# Patient Record
Sex: Female | Born: 1945 | Race: Black or African American | Hispanic: No | Marital: Single | Smoking: Never smoker
Health system: Southern US, Community
[De-identification: ages and names within clinical notes are randomized; demographics above are authoritative.]

## PROBLEM LIST (undated history)

## (undated) DIAGNOSIS — F259 Schizoaffective disorder, unspecified: Secondary | ICD-10-CM

## (undated) DIAGNOSIS — J449 Chronic obstructive pulmonary disease, unspecified: Secondary | ICD-10-CM

## (undated) DIAGNOSIS — R001 Bradycardia, unspecified: Secondary | ICD-10-CM

## (undated) DIAGNOSIS — F0390 Unspecified dementia without behavioral disturbance: Secondary | ICD-10-CM

## (undated) DIAGNOSIS — G20 Parkinson's disease: Secondary | ICD-10-CM

## (undated) DIAGNOSIS — F250 Schizoaffective disorder, bipolar type: Secondary | ICD-10-CM

## (undated) DIAGNOSIS — E119 Type 2 diabetes mellitus without complications: Secondary | ICD-10-CM

## (undated) DIAGNOSIS — D62 Acute posthemorrhagic anemia: Secondary | ICD-10-CM

## (undated) DIAGNOSIS — Z950 Presence of cardiac pacemaker: Secondary | ICD-10-CM

## (undated) DIAGNOSIS — D4959 Neoplasm of unspecified behavior of other genitourinary organ: Secondary | ICD-10-CM

## (undated) DIAGNOSIS — I10 Essential (primary) hypertension: Secondary | ICD-10-CM

## (undated) DIAGNOSIS — G20A1 Parkinson's disease without dyskinesia, without mention of fluctuations: Secondary | ICD-10-CM

## (undated) HISTORY — PX: HIP SURGERY: SHX245

---

## 2007-03-29 ENCOUNTER — Emergency Department: Payer: Self-pay | Admitting: Emergency Medicine

## 2007-03-29 ENCOUNTER — Other Ambulatory Visit: Payer: Self-pay

## 2011-09-16 LAB — COMPREHENSIVE METABOLIC PANEL
Alkaline Phosphatase: 133 U/L (ref 50–136)
BUN: 8 mg/dL (ref 7–18)
Bilirubin,Total: 0.1 mg/dL — ABNORMAL LOW (ref 0.2–1.0)
Chloride: 108 mmol/L — ABNORMAL HIGH (ref 98–107)
Creatinine: 0.67 mg/dL (ref 0.60–1.30)
EGFR (African American): >60
Glucose: 192 mg/dL — ABNORMAL HIGH (ref 65–99)
SGPT (ALT): 15 U/L
Total Protein: 7 g/dL (ref 6.4–8.2)

## 2011-09-16 LAB — TSH: Thyroid Stimulating Horm: 1.7 u[IU]/mL

## 2011-09-16 LAB — URINALYSIS, COMPLETE
Bilirubin,UR: NEGATIVE
Ketone: NEGATIVE
Ph: 5 (ref 4.5–8.0)
Protein: 30
Specific Gravity: 1.023 (ref 1.003–1.030)
Squamous Epithelial: 2

## 2011-09-16 LAB — ETHANOL
Ethanol %: <0.003 % (ref 0.000–0.080)
Ethanol: <3 mg/dL

## 2011-09-16 LAB — CBC
HGB: 15.3 g/dL (ref 12.0–16.0)
MCH: 31.6 pg (ref 26.0–34.0)
MCV: 90 fL (ref 80–100)
Platelet: 173 10*3/uL (ref 150–440)
RDW: 14.4 % (ref 11.5–14.5)
WBC: 3.9 10*3/uL (ref 3.6–11.0)

## 2011-09-16 LAB — DRUG SCREEN, URINE
Barbiturates, Ur Screen: NEGATIVE (ref ?–200)
Cannabinoid 50 Ng, Ur ~~LOC~~: NEGATIVE (ref ?–50)
Opiate, Ur Screen: NEGATIVE (ref ?–300)
Phencyclidine (PCP) Ur S: NEGATIVE (ref ?–25)

## 2011-09-18 ENCOUNTER — Inpatient Hospital Stay: Payer: Self-pay | Admitting: Psychiatry

## 2011-09-22 LAB — LIPID PANEL
Cholesterol: 115 mg/dL (ref 0–200)
HDL Cholesterol: 35 mg/dL — ABNORMAL LOW (ref 40–60)
Ldl Cholesterol, Calc: 66 mg/dL (ref 0–100)
Triglycerides: 69 mg/dL (ref 0–200)

## 2014-06-03 NOTE — H&P (Signed)
PATIENT NAME:  Felicia Grant, Felicia Grant MR#:  [id number] DATE OF BIRTH:  1945/02/27  DATE OF ADMISSION:  09/18/2011  Assessed on 09/19/2011.   REFERRING PHYSICIAN: Lenise Arena, M.D.   ATTENDING PHYSICIAN: Orson Slick, M.D.   IDENTIFYING DATA: Ms. Muller is a 69 year old female with history of psychosis.   CHIEF COMPLAINT: I want to go home.   HISTORY OF PRESENT ILLNESS: Ms. Raimondi is an extremely poor historian. It is unclear whether she is delusional or demented. She lives in a nursing home but tells me that she lives home alone and had some children who do not want to help her and has to resolve being a neighbor as a helper. She claims to be driving a car and able to take care of everything. She cooks. She shops. 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 According to her chart, the patient has been a resident of an assisted living facility for a long time. Reportedly, she started refusing her medications and became increasingly psychotic and disorganized. There is a story about alcohol. At times admitted to drinking alcohol, but apparently this is not so. She has a history of alcoholism, but has not been drinking alcohol lately. It is very difficult to make any sense of her story. On admission the patient did not respond to her name, but wanted to be called Kristian Covey.   PAST PSYCHIATRIC HISTORY: She reportedly has a long history of mental illness with a diagnosis of schizophrenia, had numerous inpatient hospitalization at [facility]. This is her first hospitalization in our hospital. She was here briefly in the Emergency Room in 2009 and was most likely transferred to another facility. We do not know her history of substance abuse or suicide attempts.   FAMILY PSYCHIATRIC HISTORY: The patient denies.   PAST MEDICAL HISTORY:  1. Hypertension.  2. Coronary artery disease.  3. Dyslipidemia.   4. Urinary incontinence.  5. Chronic obstructive pulmonary disease.   ALLERGIES: No known drug allergies.   MEDICATIONS ON ADMISSION:  1. Aspirin 81 mg.  2. Zyrtec 10 mg daily.  3. Aricept 10 mg at bedtime.  4. Lisinopril 20 mg daily.  5. Naproxen 250 mg twice daily. 6. Riomet 500 mg in 5 mL solution 10 mg twice daily with breakfast and supper. 7. Xopenex 45 mcg inhaler every 4 to 6 hours as needed for shortness of breath. 8. Risperdal 3 mg in the morning, 5 mg at bedtime. 9. Zocor 40 mg at bedtime. 10. Detrol LA 4 mg daily.   SOCIAL HISTORY: As above. The patient reports that she was in the [employer]. No details provided. She worked as a Quarry manager. She has been disabled for a long time. Apparently, the patient will be allowed to return to her group home. In addition, she has a guardian   REVIEW OF SYSTEMS: CONSTITUTIONAL: No fevers or chills. Positive for weakness and fatigue. The patient uses a walker and a wheelchair. EYES: No double or blurred vision. ENT: No hearing loss. RESPIRATORY: No shortness of breath or cough. CARDIOVASCULAR: No chest pain or orthopnea. GASTROINTESTINAL: No abdominal pain, nausea, vomiting, or diarrhea. GENITOURINARY: Positive for incontinence. She smells strongly of urine. ENDOCRINE: No heat or cold intolerance. LYMPHATIC: No anemia or easy bruising. INTEGUMENTARY: No acne or rash. MUSCULOSKELETAL: No muscle or joint pain. NEUROLOGIC: No tingling or weakness. PSYCHIATRIC: See history of present illness for details.   PHYSICAL EXAMINATION:  VITAL  SIGNS: Blood pressure 138/77, pulse 77, respirations 20, temperature 97.6.   GENERAL: This is an obese elderly female in no acute distress.   HEENT: The pupils are equal, round, and reactive to light. Sclerae are anicteric.   NECK: Supple. No thyromegaly.   LUNGS: Clear to auscultation. No dullness to percussion.   HEART: Regular rhythm and rate. No murmurs, rubs, or gallops.   ABDOMEN: Soft, nontender, nondistended.  Positive bowel sounds.   MUSCULOSKELETAL: Decreased muscle strength in all extremities.   SKIN: No rashes or bruises.   LYMPHATIC: No cervical adenopathy.   NEUROLOGIC: Cranial nerves II through XII are intact.   LABORATORY DATA: Chemistries are within normal limits except for blood glucose of 192. Blood alcohol level is 0. LFTs within normal limits. TSH 1.7. Urine tox screen negative for substances. CBC within normal limits. Urinalysis is suggestive of urinary tract infection with trace of leukocyte esterase and 16 white blood count per field with growing Escherichia coli.   MENTAL STATUS EXAMINATION: The patient is alert. She is oriented hardly to person since she believes that she is a doctor herself, not to place, time or the situation. She is cooperative, rather talkative, makes absolutely no sense. She is poorly groomed. There is a strong body odor. She maintains some eye contact. Her speech is soft. Mood is depressed with flat affect. Thought processing is disorganized. Thought content: She denies thoughts of hurting herself or others. She is paranoid and delusional. She seems to attend to internal stimuli. Her cognition is difficult to assess. Her insight and judgment are very poor.   SUICIDE RISK ASSESSMENT ON ADMISSION: This is a patient with history of psychosis who became increasingly disorganized in the context of medication noncompliance. She is in no position to plan or execute a suicide attempt. She needs supervision and support.   INITIAL DIAGNOSES:  AXIS I:  1. Paranoid schizophrenia per history, rule out urinary tract infection delirium.  2. 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   AXIS II: Deferred.   AXIS III:  1. Urinary tract infection.  2. Urinary incontinence. 3. Arthralgia.  4. Allergies.  5. Chronic obstructive pulmonary disease. 6. Dyslipidemia.   AXIS IV: Mental illness, treatment noncompliance, physical illness, primary support, cognitive decline,  loss of way of life, poor coping skills.   AXIS V: GAF on admission 25.   PLAN: The patient was admitted to [facility] unit for safety, stabilization, and medication management. She was initially placed on suicide precautions and was closely monitored for any unsafe behaviors. She underwent full psychiatric and risk assessment. She received pharmacotherapy, individual and group psychotherapy, substance abuse counseling, and support from therapeutic milieu.  1. Psychosis. We will restart her medication, Risperdal, at relatively high-dose and monitor her symptoms.  2. Cognitive decline. She is on Aricept. We will continue that. 3. Medical. She was started on Macrobid in the Emergency Room for urinary tract infection. We will continue treatment. Hopefully her symptoms resolved if they were brought about by urinary tract infection delirium. We will continue all other medications and monitor her blood pressure.  4. Social. The social worker already spoke with guardian group home. The patient will be allowed to return there when well.   DISPOSITION: As above.  ____________________________ Wardell Honour. Bary Leriche, MD jbp:ap D: 09/19/2011 18:34:35 ET T: 09/20/2011 07:03:07 ET JOB#: [id number]  cc: Kira Hartl B. Bary Leriche, MD, <Dictator> Clovis Fredrickson MD ELECTRONICALLY SIGNED 09/22/2011 23:09

## 2014-06-03 NOTE — H&P (Signed)
PATIENT NAME:  Felicia Grant, Felicia Grant MR#:  [id number] DATE OF BIRTH:  1945-08-27  DATE OF ADMISSION:  09/18/2011  IDENTIFYING INFORMATION: The patient is a 69 year old African American female who is retired as a Marine scientist and has been living at a group home for many years. The patient was brought for admission and evaluation to [facility] with chief complaint "they want to starve me to death". According to the information obtained from the intake nurse, the patient had been alcoholic and had been drinking alcohol and had falling spells and wants to get detox and is in a wheelchair. When the patient was asked about the same, she absolutely denied drinking alcohol and she said her last drink of alcohol was many years ago and she has no problems with drinking alcohol. When the Emergency Room chart was reviewed, it states that the patient is here for psychosis and there is no mention of alcohol in the chart.   PAST PSYCHIATRIC HISTORY: The patient reports that she was admitted to Psychiatry but she cannot remember the details. The patient is a very poor historian and not much details are being documented in the chart. No mention of alcohol was noted in the chart which was reviewed by the undersigned. 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   FAMILY HISTORY OF MENTAL ILLNESS: The patient denies any family history of mental illness and denies any suicide attempts.  FAMILY HISTORY: The patient reports that her parents did not want her because they wanted a boy. Her older sister raised her. She could not give any further details.   SOCIAL HISTORY: The patient reports that she graduated from high school and has two years of college.  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 She reports  she had been overseas but cannot give any details.   MARRIAGES: The patient reports she has been married but is separated for many years.   ALCOHOL AND DRUGS: The patient reports that she used to drink alcohol many years ago but currently no problem and has not had a drink in many years. The patient reports that the lady at the group home wanted to stir up everything and so she must have said she was drinking alcohol and it's not true. Denies any street drugs or prescription drug abuse. Denies smoking nicotine cigarettes.  MEDICAL HISTORY: The patient denies any history of high blood pressure or diabetes. Admits that when she was in the [employer] she was sick and she needed a new heart and her father gave her a new heart. When she was asked if she has a scar from the surgery, she denies the same. According to the chart, the patient has high blood pressure. Admits that she cannot take penicillin. When she was asked if she is being followed by any physician, she stated "I'm the doctor myself".   PHYSICAL EXAMINATION:  VITAL SIGNS: Temperature 97.3, pulse 74 per minute and regular, respirations 20 per minute and regular, blood pressure 182/90 mmHg.   GENERAL: The patient reports that she is in a wheelchair because of back problems and hip problems and she had a hip fracture on the right side and pointed to the right side. She is in pain and she cannot move much.  She reports she can barely get out of the wheelchair but refused to do so.  HEENT: Head is normocephalic and atraumatic. Pupils equal, round, and reactive to light and accommodation. Fundi bilaterally benign. EOMS intact. Tympanic membranes intact. No exudates. The patient has difficult examination while sitting in the wheelchair.  NECK: Supple without any organomegaly, lymphadenopathy, or thyromegaly.   CHEST: Normal expansion. Normal breath sounds heard.   HEART: Normal S1, S2 heard.   ABDOMEN: Very obese, soft. Bowel sounds heard.    RECTAL/PELVIC: Deferred.   NEUROLOGIC: Gait not tested as the patient is in a wheelchair and reports she has pelvic fracture and she cannot get out of the wheelchair. Cranial nerves II through XII grossly intact. Deep tendon reflexes 2+. Plantars are normal response.   MENTAL STATUS EXAMINATION: The patient is dressed in hospital pajamas, alert. She said this is a hospital but could not name the hospital. She said it was [place] but could not name the capital. She could not name the current Software engineer. Denies feeling depressed. Denies feeling hopeless and helpless. Denies hearing voices or seeing things. However, according to the information obtained from the Emergency Room the patient was brought for psychosis. According to the information obtained from the Emergency Room, the patient is paranoid but she denies feeling paranoid. When she was asked about calculations, she could not do any calculations. For 2 plus 2 she said 3 and further calculations were not done. Cognition is very basic and below average. She barely knew that she was in the hospital. She knew her name and age. Denies any active ideas or plans to hurt herself further. Regarding judgment for a fire, she reported "I roll myself out" and smiled. Insight and judgment guarded. Further mental status could not be done.   IMPRESSION:  AXIS I:  1. History of psychosis according to the Emergency Room, to be ruled out. 2. Alcohol dependence according to the intake Education officer, museum, which needs to be evaluated.   AXIS II: Deferred.   AXIS III: Chronic back pain status post hip fracture on the right side according to the patient and is in a wheelchair because of the same.   AXIS IV: Severe. Living in a group home for many years. History is not obtained and not reliable.  AXIS V: GAF 25.        PLAN: The patient is admitted to [facility] for close observation, evaluation, and help. She will be started on all the p.r.n.  medications and will be observed. Social Services is to contact the group home for more details about the patient so that appropriate treatment can be given and appropriate follow-up can be made.  ____________________________ Wallace Cullens. Franchot Mimes, MD skc:drc D: 09/18/2011 16:22:06 ET T: 09/19/2011 05:50:49 ET JOB#: [id number]  cc: Arlyn Leak K. Franchot Mimes, MD, <Dictator> Dewain Penning MD ELECTRONICALLY SIGNED 09/24/2011 19:36

## 2015-04-05 ENCOUNTER — Emergency Department (HOSPITAL_COMMUNITY): Payer: Medicare Other

## 2015-04-05 ENCOUNTER — Emergency Department (HOSPITAL_COMMUNITY)
Admission: EM | Admit: 2015-04-05 | Discharge: 2015-04-05 | Disposition: A | Discharge status: Home / Self Care | Payer: Medicare Other | Attending: Emergency Medicine | Admitting: Emergency Medicine

## 2015-04-05 ENCOUNTER — Encounter (HOSPITAL_COMMUNITY): Payer: Self-pay | Admitting: *Deleted

## 2015-04-05 DIAGNOSIS — M1611 Unilateral primary osteoarthritis, right hip: Secondary | ICD-10-CM | POA: Diagnosis not present

## 2015-04-05 DIAGNOSIS — R319 Hematuria, unspecified: Secondary | ICD-10-CM | POA: Diagnosis not present

## 2015-04-05 DIAGNOSIS — H6123 Impacted cerumen, bilateral: Secondary | ICD-10-CM | POA: Insufficient documentation

## 2015-04-05 DIAGNOSIS — J449 Chronic obstructive pulmonary disease, unspecified: Secondary | ICD-10-CM | POA: Insufficient documentation

## 2015-04-05 DIAGNOSIS — R42 Dizziness and giddiness: Secondary | ICD-10-CM | POA: Diagnosis not present

## 2015-04-05 DIAGNOSIS — Z7982 Long term (current) use of aspirin: Secondary | ICD-10-CM | POA: Insufficient documentation

## 2015-04-05 DIAGNOSIS — I10 Essential (primary) hypertension: Secondary | ICD-10-CM | POA: Diagnosis not present

## 2015-04-05 DIAGNOSIS — Z7984 Long term (current) use of oral hypoglycemic drugs: Secondary | ICD-10-CM | POA: Insufficient documentation

## 2015-04-05 DIAGNOSIS — G3183 Dementia with Lewy bodies: Secondary | ICD-10-CM | POA: Diagnosis not present

## 2015-04-05 DIAGNOSIS — E119 Type 2 diabetes mellitus without complications: Secondary | ICD-10-CM | POA: Insufficient documentation

## 2015-04-05 DIAGNOSIS — F250 Schizoaffective disorder, bipolar type: Secondary | ICD-10-CM | POA: Diagnosis not present

## 2015-04-05 DIAGNOSIS — Z79899 Other long term (current) drug therapy: Secondary | ICD-10-CM | POA: Diagnosis not present

## 2015-04-05 DIAGNOSIS — S79911A Unspecified injury of right hip, initial encounter: Secondary | ICD-10-CM | POA: Diagnosis present

## 2015-04-05 DIAGNOSIS — Y998 Other external cause status: Secondary | ICD-10-CM | POA: Insufficient documentation

## 2015-04-05 DIAGNOSIS — Y9301 Activity, walking, marching and hiking: Secondary | ICD-10-CM | POA: Insufficient documentation

## 2015-04-05 DIAGNOSIS — Y92128 Other place in nursing home as the place of occurrence of the external cause: Secondary | ICD-10-CM | POA: Diagnosis not present

## 2015-04-05 DIAGNOSIS — W19XXXA Unspecified fall, initial encounter: Secondary | ICD-10-CM

## 2015-04-05 DIAGNOSIS — W1839XA Other fall on same level, initial encounter: Secondary | ICD-10-CM | POA: Insufficient documentation

## 2015-04-05 HISTORY — DX: Type 2 diabetes mellitus without complications: E11.9

## 2015-04-05 HISTORY — DX: Bradycardia, unspecified: R00.1

## 2015-04-05 HISTORY — DX: Schizoaffective disorder, unspecified: F25.9

## 2015-04-05 HISTORY — DX: Essential (primary) hypertension: I10

## 2015-04-05 HISTORY — DX: Parkinson's disease without dyskinesia, without mention of fluctuations: G20.A1

## 2015-04-05 HISTORY — DX: Unspecified dementia, unspecified severity, without behavioral disturbance, psychotic disturbance, mood disturbance, and anxiety: F03.90

## 2015-04-05 HISTORY — DX: Chronic obstructive pulmonary disease, unspecified: J44.9

## 2015-04-05 HISTORY — DX: Parkinson's disease: G20

## 2015-04-05 HISTORY — DX: Schizoaffective disorder, bipolar type: F25.0

## 2015-04-05 LAB — URINALYSIS, ROUTINE W REFLEX MICROSCOPIC
BILIRUBIN URINE: NEGATIVE
Glucose, UA: NEGATIVE mg/dL
KETONES UR: NEGATIVE mg/dL
Leukocytes, UA: NEGATIVE
NITRITE: NEGATIVE
PROTEIN: NEGATIVE mg/dL
Specific Gravity, Urine: 1.025 (ref 1.005–1.030)
pH: 5 (ref 5.0–8.0)

## 2015-04-05 LAB — URINE MICROSCOPIC-ADD ON
BACTERIA UA: NONE SEEN
WBC UA: NONE SEEN WBC/hpf (ref 0–5)

## 2015-04-05 NOTE — ED Provider Notes (Signed)
CSN: [id number]     Arrival date & time 04/05/15  [id number] History   First MD Initiated Contact with Patient 04/05/15 [phone]     Chief Complaint  Patient presents with  . Fall     (Consider location/radiation/quality/duration/timing/severity/associated sxs/prior Treatment) The history is provided by the patient, the nursing home and medical records.   Felicia Grant is a 70 y.o. female from a local assisted living facility presenting with reports of falls x 2 over the past 3 days.  She walking using a walker at baseline, describes having constant pain, especially in her right hip and leg which "gave out" on her today causing her fall today.  She denies head injury, denies lightheadedness, but does endorse some dizziness and decreased hearing acuity at baseline.  She is a diabetic, has parkinsons disease and has dementia at baseline but appears able to give a fairly good history.  She denies back pain, focal weakness or numbness.      Past Medical History  Diagnosis Date  . Dementia   . Bradycardia   . Hypertension   . Diabetes mellitus without complication ([place])   . Parkinson's disease ([place])   . Schizo affective schizophrenia ([place])   . COPD (chronic obstructive pulmonary disease) [facility])    Past Surgical History  Procedure Laterality Date  . Hip surgery     No family history on file. Social History  Substance Use Topics  . Smoking status: Never Smoker   . Smokeless tobacco: None  . Alcohol Use: No   OB History    No data available     Review of Systems  Constitutional: Negative for fever.  HENT: Positive for hearing loss. Negative for congestion, ear discharge, ear pain and sore throat.   Eyes: Negative.   Respiratory: Negative for chest tightness and shortness of breath.   Cardiovascular: Negative for chest pain.  Gastrointestinal: Negative for nausea and abdominal pain.  Genitourinary: Negative.   Musculoskeletal: Positive for arthralgias. Negative for back pain, joint  swelling and neck pain.  Skin: Negative.  Negative for rash and wound.  Neurological: Positive for dizziness. Negative for weakness, light-headedness, numbness and headaches.  Psychiatric/Behavioral: Negative.       Allergies  Review of patient's allergies indicates no known allergies.  Home Medications   Prior to Admission medications   Medication Sig Start Date End Date Taking? Authorizing Provider  amLODipine (NORVASC) 5 MG tablet Take 5 mg by mouth daily.   Yes Historical Provider, MD  aspirin EC 81 MG tablet Take 81 mg by mouth daily.   Yes Historical Provider, MD  cetirizine (ZYRTEC) 10 MG tablet Take 10 mg by mouth daily.   Yes Historical Provider, MD  cholecalciferol (VITAMIN D) 400 units TABS tablet Take 400 Units by mouth daily.   Yes Historical Provider, MD  ferrous sulfate 325 (65 FE) MG tablet Take 325 mg by mouth at bedtime.   Yes Historical Provider, MD  lisinopril (PRINIVIL,ZESTRIL) 40 MG tablet Take 40 mg by mouth daily.   Yes Historical Provider, MD  metFORMIN (GLUCOPHAGE) 500 MG tablet Take 500-1,000 mg by mouth 2 (two) times daily with a meal. 2 tablets every morning and 1 tablet every evening   Yes Historical Provider, MD  nystatin cream (MYCOSTATIN) Apply 1 application topically 2 (two) times daily.   Yes Historical Provider, MD  risperiDONE (RISPERDAL) 1 MG tablet Take 1 mg by mouth at bedtime.   Yes Historical Provider, MD  senna (SENOKOT) 8.6 MG TABS tablet Take 1  tablet by mouth daily.   Yes Historical Provider, MD  simvastatin (ZOCOR) 40 MG tablet Take 40 mg by mouth at bedtime.   Yes Historical Provider, MD  tolterodine (DETROL LA) 4 MG 24 hr capsule Take 4 mg by mouth daily.   Yes Historical Provider, MD   BP 124/80 mmHg  Pulse 73  Temp(Src) 98.4 F (36.9 C) (Oral)  Resp 14  SpO2 98% Physical Exam  Constitutional: She appears well-nourished.  HENT:  Head: Normocephalic and atraumatic.  Right Ear: No mastoid tenderness. Decreased hearing is noted.   Left Ear: No mastoid tenderness. Decreased hearing is noted.  Mouth/Throat: Uvula is midline and oropharynx is clear and moist. Mucous membranes are not pale and not dry.  Bilateral cerumen impaction.  Unable to visualize TM's.  Eyes: Pupils are equal, round, and reactive to light. Right eye exhibits no nystagmus. Left eye exhibits no nystagmus.  Neck: Normal range of motion. No spinous process tenderness and no muscular tenderness present. Normal range of motion present.  Cardiovascular: Normal rate, regular rhythm, normal heart sounds and intact distal pulses.   Pulses:      Dorsalis pedis pulses are 2+ on the right side, and 2+ on the left side.  Pulmonary/Chest: Effort normal and breath sounds normal. She has no wheezes.  Abdominal: Soft. Bowel sounds are normal. There is no tenderness.  Musculoskeletal: Normal range of motion.       Right hip: She exhibits bony tenderness. She exhibits no deformity.       Lumbar back: She exhibits no bony tenderness.  ttp right anterior groin radiating to right greater trochanter. No leg shortening or rotation.  Neurological: She is alert. She has normal strength. No cranial nerve deficit or sensory deficit.  Equal grip strength.  Skin: Skin is warm and dry.  Psychiatric: She has a normal mood and affect.  Nursing note and vitals reviewed.   ED Course  Procedures (including critical care time) Labs Review Labs Reviewed  URINALYSIS, [place] (NOT AT [facility]) - Abnormal; Notable for the following:    Hgb urine dipstick LARGE (*)    All other components within normal limits  URINE MICROSCOPIC-ADD ON - Abnormal; Notable for the following:    Squamous Epithelial / LPF 0-5 (*)    All other components within normal limits    Imaging Review Dg Lumbar Spine Complete  04/05/2015  CLINICAL DATA:  Fall.  Back pain. EXAM: LUMBAR SPINE - COMPLETE 4+ VIEW COMPARISON:  None. FINDINGS: No fracture. Minimal anterolisthesis of L4 on L5 with a  mild retrolisthesis of L5 on S1. Mild loss disc height at L4-L5 with moderate loss of disc height at L5-S1. There are minor endplate osteophytes along the mid to lower lumbar spine. Bones are diffusely demineralized. Calcifications seen along a normal caliber abdominal aorta. There is significant increased stool noted throughout the visualized colon. IMPRESSION: 1. No fracture or acute finding. 2. Degenerative changes as described with diffuse bone demineralization. 3. Significant increased stool noted throughout the visualized colon. Electronically Signed   By: Lajean Manes M.D.   On: 04/05/2015 11:26   Ct Head Wo Contrast  04/05/2015  CLINICAL DATA:  Pt comes in from [facility]. Pt states she was walking down the hall and her "legs gave out." Pt denies hitting her head or any dizziness when she fell. Hx of diabetes,HTN,dementia, Parkinson".[place] EXAM: CT HEAD WITHOUT CONTRAST TECHNIQUE: Contiguous axial images were obtained from the base of the skull through the  vertex without intravenous contrast. COMPARISON:  None. FINDINGS: There is mild periventricular white matter change consistent with small vessel disease. Small lacunar infarct identified within the left globus pallidus is remote. There is no intra or extra-axial fluid collection or mass lesion. The basilar cisterns and ventricles have a normal appearance. There is no CT evidence for acute infarction or hemorrhage. There is mild mucoperiosteal thickening within the paranasal sinuses. No air-fluid levels are identified to indicate acute sinusitis. There is atherosclerotic calcification of the internal carotid arteries. No acute calvarial injury. IMPRESSION: 1. Small vessel disease. 2. 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 3. Mild, chronic sinusitis. 4.  No evidence for acute intracranial abnormality. Electronically Signed   By: Nolon Nations M.D.   On: 04/05/2015 11:17   Ct Hip Right Wo Contrast  04/05/2015   CLINICAL DATA:  Status post fall today with right hip injury and pain. Initial encounter. EXAM: CT OF THE RIGHT HIP WITHOUT CONTRAST TECHNIQUE: Multidetector CT imaging of the right hip was performed according to the standard protocol. Multiplanar CT image reconstructions were also generated. COMPARISON:  Plain films of the right hip earlier today. FINDINGS: There is no fracture or dislocation. The patient has severe right hip osteoarthritis with bone-on-bone joint space narrowing, subchondral sclerosis and very bulky osteophytosis. Musculature about the right hip is intact and normal in appearance. Imaged intrapelvic contents demonstrate some atherosclerosis but are otherwise unremarkable. IMPRESSION: No acute abnormality. Severe right hip osteoarthritis. Electronically Signed   By: Inge Rise M.D.   On: 04/05/2015 13:15   Dg Hip Unilat W Or W/o Pelvis 2-3 Views Right  04/05/2015  CLINICAL DATA:  Fall today, pain lower back and right hip EXAM: DG HIP (WITH OR WITHOUT PELVIS) 2-3V RIGHT COMPARISON:  None. FINDINGS: There is significant osteoarthritis of the right hip associated with moderate to severe deformity. There is loss of joint space. Fragmentation around the joint is likely related to large osteophytes at the acetabulum at as well as the femoral head. However it is difficult to exclude a minimally displaced fracture. There is no evidence for dislocation. Degenerative changes are seen in the left hip to lesser degree. IMPRESSION: 1. Significant deformity of the right hip, likely related entirely to degenerative changes. 2. However, because a minimally displaced fracture is not excluded, consider CT of the hip for further to exclude fracture in the setting of trauma. Electronically Signed   By: Nolon Nations M.D.   On: 04/05/2015 11:29   I have personally reviewed and evaluated these images and lab results as part of my medical decision-making.   EKG Interpretation None      MDM   Final  diagnoses:  Primary osteoarthritis of right hip  Fall, initial encounter  Cerumen impaction, bilateral  Hematuria     Radiological studies were viewed, interpreted and considered during the medical decision making and disposition process. I agree with radiologists reading.  Results were also discussed with patient.   Recommended f/u with pcp prn, pt may benefit from ortho referral re right hip advanced degenerative changes.  Hearing acuity issue, dizziness also possibly at last somewhat due to cerumen impactions.  Advised flushing by nursing home staff.  Advised f/u with pcp for further tx/eval if sx persist.     Evalee Jefferson, PA-C 04/05/15 2141  Noemi Chapel, MD 04/08/15 1201

## 2015-04-05 NOTE — ED Notes (Signed)
Report given to Maudie Mercury at [facility].

## 2015-04-05 NOTE — ED Notes (Signed)
Pt comes in from [facility]. Pt states she was walking down the hall and her "legs gave out." Pt denies hitting her head or any dizziness when she fell. Caregiver at bedside states that she frequently talks about her legs hurting. Pt is alert and oriented at this time

## 2015-04-05 NOTE — ED Notes (Signed)
Pt was ambualted in [place] a little trouble moving right leg.

## 2015-04-05 NOTE — Discharge Instructions (Signed)
Osteoarthritis Osteoarthritis is a disease that causes soreness and inflammation of a joint. It occurs when the cartilage at the affected joint wears down. Cartilage acts as a cushion, covering the ends of bones where they meet to form a joint. Osteoarthritis is the most common form of arthritis. It often occurs in older people. The joints affected most often by this condition include those in the:  Ends of the fingers.  Thumbs.  Neck.  Lower back.  Knees.  Hips. CAUSES  Over time, the cartilage that covers the ends of bones begins to wear away. This causes bone to rub on bone, producing pain and stiffness in the affected joints.  RISK FACTORS Certain factors can increase your chances of having osteoarthritis, including:  Older age.  Excessive body weight.  Overuse of joints.  Previous joint injury. SIGNS AND SYMPTOMS   Pain, swelling, and stiffness in the joint.  Over time, the joint may lose its normal shape.  Small deposits of bone (osteophytes) may grow on the edges of the joint.  Bits of bone or cartilage can break off and float inside the joint space. This may cause more pain and damage. DIAGNOSIS  Your health care provider will do a physical exam and ask about your symptoms. Various tests may be ordered, such as:  X-rays of the affected joint.  Blood tests to rule out other types of arthritis. Additional tests may be used to diagnose your condition. TREATMENT  Goals of treatment are to control pain and improve joint function. Treatment plans may include:  A prescribed exercise program that allows for rest and joint relief.  A weight control plan.  Pain relief techniques, such as:  Properly applied heat and cold.  Electric pulses delivered to nerve endings under the skin (transcutaneous electrical nerve stimulation [TENS]).  Massage.  Certain nutritional supplements.  Medicines to control pain, such as:  Acetaminophen.  Nonsteroidal  anti-inflammatory drugs (NSAIDs), such as naproxen.  Narcotic or central-acting agents, such as tramadol.  Corticosteroids. These can be given orally or as an injection.  Surgery to reposition the bones and relieve pain (osteotomy) or to remove loose pieces of bone and cartilage. Joint replacement may be needed in advanced states of osteoarthritis. HOME CARE INSTRUCTIONS   Take medicines only as directed by your health care provider.  Maintain a healthy weight. Follow your health care provider's instructions for weight control. This may include dietary instructions.  Exercise as directed. Your health care provider can recommend specific types of exercise. These may include:  Strengthening exercises. These are done to strengthen the muscles that support joints affected by arthritis. They can be performed with weights or with exercise bands to add resistance.  Aerobic activities. These are exercises, such as brisk walking or low-impact aerobics, that get your heart pumping.  Range-of-motion activities. These keep your joints limber.  Balance and agility exercises. These help you maintain daily living skills.  Rest your affected joints as directed by your health care provider.  Keep all follow-up visits as directed by your health care provider. SEEK MEDICAL CARE IF:   Your skin turns red.  You develop a rash in addition to your joint pain.  You have worsening joint pain.  You have a fever along with joint or muscle aches. SEEK IMMEDIATE MEDICAL CARE IF:  You have a significant loss of weight or appetite.  You have night sweats. [place] of Arthritis and Musculoskeletal and Skin Diseases: www.niams.SouthExposed.es  National Institute on  Aging: http://kim-miller.com/  American College of Rheumatology: www.rheumatology.org   This information is not intended to replace advice given to you by your health care provider. Make sure you discuss any questions you  have with your health care provider.   Document Released: 01/31/2005 Document Revised: 02/21/2014 Document Reviewed: 10/08/2012 Elsevier Interactive Patient Education 2016 [place] Impaction The structures of the external ear canal secrete a waxy substance known as cerumen. Excess cerumen can build up in the ear canal, causing a condition known as cerumen impaction. Cerumen impaction can cause ear pain and disrupt the function of the ear. The rate of cerumen production differs for each individual. In certain individuals, the configuration of the ear canal may decrease his or her ability to naturally remove cerumen. CAUSES Cerumen impaction is caused by excessive cerumen production or buildup. RISK FACTORS  Frequent use of swabs to clean ears.  Having narrow ear canals.  Having eczema.  Being dehydrated. SIGNS AND SYMPTOMS  Diminished hearing.  Ear drainage.  Ear pain.  Ear itch. TREATMENT Treatment may involve:  Over-the-counter or prescription ear drops to soften the cerumen.  Removal of cerumen by a health care provider. This may be done with:  Irrigation with warm water. This is the most common method of removal.  Ear curettes and other instruments.  Surgery. This may be done in severe cases. HOME CARE INSTRUCTIONS  Take medicines only as directed by your health care provider.  Do not insert objects into the ear with the intent of cleaning the ear. PREVENTION  Do not insert objects into the ear, even with the intent of cleaning the ear. Removing cerumen as a part of normal hygiene is not necessary, and the use of swabs in the ear canal is not recommended.  Drink enough water to keep your urine clear or pale yellow.  Control your eczema if you have it. SEEK MEDICAL CARE IF:  You develop ear pain.  You develop bleeding from the ear.  The cerumen does not clear after you use ear drops as directed.   This information is not intended to replace  advice given to you by your health care provider. Make sure you discuss any questions you have with your health care provider.   Document Released: 03/10/2004 Document Revised: 02/21/2014 Document Reviewed: 09/17/2014 Elsevier Interactive Patient Education 2016 [place] and CT scan today are negative for bony fractures.  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  Additionally, your exam findings indicate you have a bilateral cerumen impaction (ear wax buildup in your ears) which could potentially at least partially be the source of your hearing loss and dizziness.  I suggest the nursing staff at your facility flush your ears to remove this ear wax.  Also, you have microscopic evidence of blood in your urine, but no sign of infection.  You need to have a repeat urinalysis in a week to see if this blood in your urine persists.

## 2015-04-15 ENCOUNTER — Inpatient Hospital Stay (HOSPITAL_COMMUNITY): Payer: Medicare Other

## 2015-04-15 ENCOUNTER — Emergency Department (HOSPITAL_COMMUNITY): Payer: Medicare Other

## 2015-04-15 ENCOUNTER — Encounter (HOSPITAL_COMMUNITY): Payer: Self-pay | Admitting: Emergency Medicine

## 2015-04-15 ENCOUNTER — Inpatient Hospital Stay (HOSPITAL_COMMUNITY)
Admission: EM | Admit: 2015-04-15 | Discharge: 2015-04-21 | DRG: 853 | Disposition: A | Discharge status: Nursing Home (Includes ICF) | Payer: Medicare Other | Attending: Internal Medicine | Admitting: Internal Medicine

## 2015-04-15 DIAGNOSIS — J449 Chronic obstructive pulmonary disease, unspecified: Secondary | ICD-10-CM | POA: Diagnosis present

## 2015-04-15 DIAGNOSIS — F259 Schizoaffective disorder, unspecified: Secondary | ICD-10-CM | POA: Diagnosis present

## 2015-04-15 DIAGNOSIS — E118 Type 2 diabetes mellitus with unspecified complications: Secondary | ICD-10-CM | POA: Diagnosis not present

## 2015-04-15 DIAGNOSIS — E669 Obesity, unspecified: Secondary | ICD-10-CM | POA: Diagnosis present

## 2015-04-15 DIAGNOSIS — A419 Sepsis, unspecified organism: Principal | ICD-10-CM

## 2015-04-15 DIAGNOSIS — N179 Acute kidney failure, unspecified: Secondary | ICD-10-CM | POA: Diagnosis not present

## 2015-04-15 DIAGNOSIS — N320 Bladder-neck obstruction: Secondary | ICD-10-CM | POA: Diagnosis present

## 2015-04-15 DIAGNOSIS — E119 Type 2 diabetes mellitus without complications: Secondary | ICD-10-CM | POA: Diagnosis present

## 2015-04-15 DIAGNOSIS — R1030 Lower abdominal pain, unspecified: Secondary | ICD-10-CM | POA: Diagnosis present

## 2015-04-15 DIAGNOSIS — Z7982 Long term (current) use of aspirin: Secondary | ICD-10-CM

## 2015-04-15 DIAGNOSIS — N950 Postmenopausal bleeding: Secondary | ICD-10-CM | POA: Diagnosis present

## 2015-04-15 DIAGNOSIS — Z6834 Body mass index (BMI) 34.0-34.9, adult: Secondary | ICD-10-CM

## 2015-04-15 DIAGNOSIS — B964 Proteus (mirabilis) (morganii) as the cause of diseases classified elsewhere: Secondary | ICD-10-CM | POA: Diagnosis present

## 2015-04-15 DIAGNOSIS — N368 Other specified disorders of urethra: Secondary | ICD-10-CM | POA: Diagnosis present

## 2015-04-15 DIAGNOSIS — G20A1 Parkinson's disease without dyskinesia, without mention of fluctuations: Secondary | ICD-10-CM

## 2015-04-15 DIAGNOSIS — F0390 Unspecified dementia without behavioral disturbance: Secondary | ICD-10-CM | POA: Diagnosis present

## 2015-04-15 DIAGNOSIS — I10 Essential (primary) hypertension: Secondary | ICD-10-CM | POA: Diagnosis present

## 2015-04-15 DIAGNOSIS — I959 Hypotension, unspecified: Secondary | ICD-10-CM

## 2015-04-15 DIAGNOSIS — N170 Acute kidney failure with tubular necrosis: Secondary | ICD-10-CM | POA: Diagnosis present

## 2015-04-15 DIAGNOSIS — M545 Low back pain: Secondary | ICD-10-CM | POA: Diagnosis present

## 2015-04-15 DIAGNOSIS — R338 Other retention of urine: Secondary | ICD-10-CM | POA: Diagnosis not present

## 2015-04-15 DIAGNOSIS — D62 Acute posthemorrhagic anemia: Secondary | ICD-10-CM | POA: Diagnosis present

## 2015-04-15 DIAGNOSIS — D509 Iron deficiency anemia, unspecified: Secondary | ICD-10-CM | POA: Diagnosis not present

## 2015-04-15 DIAGNOSIS — G8929 Other chronic pain: Secondary | ICD-10-CM | POA: Diagnosis present

## 2015-04-15 DIAGNOSIS — C52 Malignant neoplasm of vagina: Secondary | ICD-10-CM | POA: Diagnosis present

## 2015-04-15 DIAGNOSIS — N136 Pyonephrosis: Secondary | ICD-10-CM | POA: Diagnosis present

## 2015-04-15 DIAGNOSIS — N3289 Other specified disorders of bladder: Secondary | ICD-10-CM | POA: Diagnosis present

## 2015-04-15 DIAGNOSIS — E872 Acidosis, unspecified: Secondary | ICD-10-CM

## 2015-04-15 DIAGNOSIS — C579 Malignant neoplasm of female genital organ, unspecified: Secondary | ICD-10-CM | POA: Diagnosis not present

## 2015-04-15 DIAGNOSIS — Z7984 Long term (current) use of oral hypoglycemic drugs: Secondary | ICD-10-CM | POA: Diagnosis not present

## 2015-04-15 DIAGNOSIS — R112 Nausea with vomiting, unspecified: Secondary | ICD-10-CM | POA: Diagnosis present

## 2015-04-15 DIAGNOSIS — D398 Neoplasm of uncertain behavior of other specified female genital organs: Secondary | ICD-10-CM | POA: Diagnosis not present

## 2015-04-15 DIAGNOSIS — Z950 Presence of cardiac pacemaker: Secondary | ICD-10-CM | POA: Diagnosis not present

## 2015-04-15 DIAGNOSIS — E538 Deficiency of other specified B group vitamins: Secondary | ICD-10-CM | POA: Diagnosis present

## 2015-04-15 DIAGNOSIS — G20 Parkinson's disease: Secondary | ICD-10-CM | POA: Diagnosis present

## 2015-04-15 DIAGNOSIS — F250 Schizoaffective disorder, bipolar type: Secondary | ICD-10-CM

## 2015-04-15 DIAGNOSIS — R509 Fever, unspecified: Secondary | ICD-10-CM

## 2015-04-15 DIAGNOSIS — E875 Hyperkalemia: Secondary | ICD-10-CM | POA: Diagnosis present

## 2015-04-15 DIAGNOSIS — N131 Hydronephrosis with ureteral stricture, not elsewhere classified: Secondary | ICD-10-CM | POA: Diagnosis not present

## 2015-04-15 DIAGNOSIS — I9589 Other hypotension: Secondary | ICD-10-CM

## 2015-04-15 DIAGNOSIS — D4959 Neoplasm of unspecified behavior of other genitourinary organ: Secondary | ICD-10-CM

## 2015-04-15 DIAGNOSIS — N10 Acute tubulo-interstitial nephritis: Secondary | ICD-10-CM

## 2015-04-15 DIAGNOSIS — N1330 Unspecified hydronephrosis: Secondary | ICD-10-CM | POA: Diagnosis not present

## 2015-04-15 HISTORY — DX: Presence of cardiac pacemaker: Z95.0

## 2015-04-15 HISTORY — DX: Acute posthemorrhagic anemia: D62

## 2015-04-15 HISTORY — DX: Neoplasm of unspecified behavior of other genitourinary organ: D49.59

## 2015-04-15 LAB — CBC WITH DIFFERENTIAL/PLATELET
Basophils Absolute: 0 10*3/uL (ref 0.0–0.1)
Basophils Relative: 0 %
EOS ABS: 0 10*3/uL (ref 0.0–0.7)
EOS PCT: 0 %
HCT: 27.3 % — ABNORMAL LOW (ref 36.0–46.0)
Hemoglobin: 8.3 g/dL — ABNORMAL LOW (ref 12.0–15.0)
LYMPHS ABS: 0.9 10*3/uL (ref 0.7–4.0)
LYMPHS PCT: 19 %
MCH: 21.8 pg — AB (ref 26.0–34.0)
MCHC: 30.4 g/dL (ref 30.0–36.0)
MCV: 71.8 fL — AB (ref 78.0–100.0)
MONO ABS: 1.6 10*3/uL — AB (ref 0.1–1.0)
MONOS PCT: 34 %
Neutro Abs: 2.2 10*3/uL (ref 1.7–7.7)
Neutrophils Relative %: 47 %
PLATELETS: 198 10*3/uL (ref 150–400)
RBC: 3.8 MIL/uL — AB (ref 3.87–5.11)
RDW: 19.3 % — ABNORMAL HIGH (ref 11.5–15.5)
WBC: 4.6 10*3/uL (ref 4.0–10.5)

## 2015-04-15 LAB — COMPREHENSIVE METABOLIC PANEL WITH GFR
ALT: 10 U/L — ABNORMAL LOW (ref 14–54)
AST: 22 U/L (ref 15–41)
Albumin: 3.1 g/dL — ABNORMAL LOW (ref 3.5–5.0)
Alkaline Phosphatase: 66 U/L (ref 38–126)
Anion gap: 12 (ref 5–15)
BUN: 93 mg/dL — ABNORMAL HIGH (ref 6–20)
CO2: 19 mmol/L — ABNORMAL LOW (ref 22–32)
Calcium: 8.5 mg/dL — ABNORMAL LOW (ref 8.9–10.3)
Chloride: 103 mmol/L (ref 101–111)
Creatinine, Ser: 4.95 mg/dL — ABNORMAL HIGH (ref 0.44–1.00)
GFR calc Af Amer: 9 mL/min — ABNORMAL LOW
GFR calc non Af Amer: 8 mL/min — ABNORMAL LOW
Glucose, Bld: 208 mg/dL — ABNORMAL HIGH (ref 65–99)
Potassium: 5.1 mmol/L (ref 3.5–5.1)
Sodium: 134 mmol/L — ABNORMAL LOW (ref 135–145)
Total Bilirubin: 0.5 mg/dL (ref 0.3–1.2)
Total Protein: 6.2 g/dL — ABNORMAL LOW (ref 6.5–8.1)

## 2015-04-15 LAB — URINE MICROSCOPIC-ADD ON

## 2015-04-15 LAB — URINALYSIS, ROUTINE W REFLEX MICROSCOPIC
Glucose, UA: 100 mg/dL — AB
Ketones, ur: NEGATIVE mg/dL
Nitrite: POSITIVE — AB
Protein, ur: >300 mg/dL — AB
Specific Gravity, Urine: 1.015 (ref 1.005–1.030)
pH: 8.5 — ABNORMAL HIGH (ref 5.0–8.0)

## 2015-04-15 LAB — TSH: TSH: 1.131 u[IU]/mL (ref 0.350–4.500)

## 2015-04-15 LAB — RETICULOCYTES
RBC.: 3.61 MIL/uL — ABNORMAL LOW (ref 3.87–5.11)
RETIC CT PCT: 0.5 % (ref 0.4–3.1)
Retic Count, Absolute: 18.1 10*3/uL — ABNORMAL LOW (ref 19.0–186.0)

## 2015-04-15 LAB — I-STAT CG4 LACTIC ACID, ED
Lactic Acid, Venous: 2.41 mmol/L (ref 0.5–2.0)
Lactic Acid, Venous: 1.7 mmol/L (ref 0.5–2.0)

## 2015-04-15 MED ORDER — PANTOPRAZOLE SODIUM 40 MG IV SOLR
40.0000 mg | Freq: Every day | INTRAVENOUS | Status: DC
  Administered 2015-04-15 – 2015-04-19 (×5): 40 mg via INTRAVENOUS
  Filled 2015-04-15 (×5): qty 40

## 2015-04-15 MED ORDER — SODIUM CHLORIDE 0.9 % IV BOLUS (SEPSIS): 1000.0000 mL | INTRAVENOUS | Status: DC

## 2015-04-15 MED ORDER — INSULIN ASPART 100 UNIT/ML ~~LOC~~ SOLN
0.0000 [IU] | Freq: Three times a day (TID) | SUBCUTANEOUS | Status: DC
  Administered 2015-04-16 (×3): 2 [IU] via SUBCUTANEOUS
  Administered 2015-04-17: 3 [IU] via SUBCUTANEOUS

## 2015-04-15 MED ORDER — ALBUTEROL SULFATE (2.5 MG/3ML) 0.083% IN NEBU
2.5000 mg | INHALATION_SOLUTION | RESPIRATORY_TRACT | Status: DC | PRN
Start: 2015-04-15 — End: 2015-04-21

## 2015-04-15 MED ORDER — SODIUM CHLORIDE 0.9 % IV SOLN
INTRAVENOUS | Status: DC
  Administered 2015-04-15: 20:00:00 via INTRAVENOUS

## 2015-04-15 MED ORDER — MORPHINE SULFATE (PF) 4 MG/ML IV SOLN
3.0000 mg | INTRAVENOUS | Status: DC | PRN
  Administered 2015-04-16: 3 mg via INTRAVENOUS
  Filled 2015-04-15: qty 1

## 2015-04-15 MED ORDER — ACETAMINOPHEN 650 MG RE SUPP: 650.0000 mg | Freq: Four times a day (QID) | RECTAL | Status: DC | PRN

## 2015-04-15 MED ORDER — ALUM & MAG HYDROXIDE-SIMETH 200-200-20 MG/5ML PO SUSP: 30.0000 mL | Freq: Four times a day (QID) | ORAL | Status: DC | PRN

## 2015-04-15 MED ORDER — VANCOMYCIN HCL 10 G IV SOLR
1500.0000 mg | Freq: Once | INTRAVENOUS | Status: AC
  Administered 2015-04-15: 1500 mg via INTRAVENOUS
  Filled 2015-04-15: qty 1500

## 2015-04-15 MED ORDER — PIPERACILLIN-TAZOBACTAM 3.375 G IVPB 30 MIN
3.3750 g | Freq: Once | INTRAVENOUS | Status: AC
  Administered 2015-04-15: 3.375 g via INTRAVENOUS
  Filled 2015-04-15: qty 50

## 2015-04-15 MED ORDER — VANCOMYCIN HCL IN DEXTROSE 1-5 GM/200ML-% IV SOLN: 1000.0000 mg | Freq: Once | INTRAVENOUS | Status: DC

## 2015-04-15 MED ORDER — ACETAMINOPHEN 325 MG PO TABS
650.0000 mg | ORAL_TABLET | Freq: Four times a day (QID) | ORAL | Status: DC | PRN
  Administered 2015-04-17 – 2015-04-20 (×2): 650 mg via ORAL
  Filled 2015-04-15 (×2): qty 2

## 2015-04-15 MED ORDER — ONDANSETRON HCL 4 MG/2ML IJ SOLN
4.0000 mg | Freq: Four times a day (QID) | INTRAMUSCULAR | Status: DC
  Administered 2015-04-15 – 2015-04-21 (×21): 4 mg via INTRAVENOUS
  Filled 2015-04-15 (×21): qty 2

## 2015-04-15 MED ORDER — SODIUM CHLORIDE 0.9 % IV BOLUS (SEPSIS)
1000.0000 mL | INTRAVENOUS | Status: AC
  Administered 2015-04-15 (×3): 1000 mL via INTRAVENOUS

## 2015-04-15 MED ORDER — RISPERIDONE 1 MG PO TABS
1.0000 mg | ORAL_TABLET | Freq: Every day | ORAL | Status: DC
  Administered 2015-04-16 – 2015-04-20 (×6): 1 mg via ORAL
  Filled 2015-04-15 (×8): qty 1

## 2015-04-15 NOTE — ED Notes (Signed)
Several attempts have been made at catheter placement by different staff members unsuccessfully.

## 2015-04-15 NOTE — Progress Notes (Signed)
Pharmacy Antibiotic Note  Felicia Grant is a 70 y.o. female admitted on 04/15/2015 with sepsis.  Pharmacy has been consulted for [place] dosing.  Estimated Creatinine Clearance: 11.4 mL/min (by C-G formula based on Cr of 4.95).  Plan: Zosyn 2.25gm IV q8h (renally adjusted) Vancomycin 1500mg  IV today x 1 (no additional Vanc today) F/U SCr tomorrow, evaluate Vanc maintenance dose  Height: 5\' 4"  (162.6 cm) Weight: 200 lb (90.719 kg) IBW/kg (Calculated) : 54.7  Temp (24hrs), Avg:99.1 F (37.3 C), Min:98 F (36.7 C), Max:100.6 F (38.1 C)   Recent Labs Lab 04/15/15 1106 04/15/15 1125 04/15/15 1359  WBC 4.6  --   --   CREATININE 4.95*  --   --   LATICACIDVEN  --  2.41* 1.70    No Known Allergies  Anti-infectives    Start     Dose/Rate Route Frequency Ordered Stop   04/15/15 1145  vancomycin (VANCOCIN) 1,500 mg in sodium chloride 0.9 % 500 mL IVPB     1,500 mg 250 mL/hr over 120 Minutes Intravenous  Once 04/15/15 1111 04/15/15 1343   04/15/15 1100  piperacillin-tazobactam (ZOSYN) IVPB 3.375 g     3.375 g 100 mL/hr over 30 Minutes Intravenous  Once 04/15/15 1051 04/15/15 1231   04/15/15 1100  vancomycin (VANCOCIN) IVPB 1000 mg/200 mL premix  Status:  Discontinued     1,000 mg 200 mL/hr over 60 Minutes Intravenous  Once 04/15/15 1051 04/15/15 1111     Results for orders placed or performed in visit on 09/18/11  Urine culture     Status: None   Collection Time: 09/16/11  6:11 PM  Result Value Ref Range Status   Micro Text Report   Final       SOURCE: CLEAN CATCH    ORGANISM 1                -   COMMENT                   MIXED BACTERIAL ORGANISMS   COMMENT                   RESULTS SUGGESTIVE OF CONTAMINATION   COMMENT                   SUGGEST RECOLLECTION   ANTIBIOTIC                                                       Thank you for allowing pharmacy to be a part of this patient's care.  Hart Robinsons A 04/15/2015 4:06 PM

## 2015-04-15 NOTE — ED Notes (Signed)
Attempted to insert foley x 2 with no success.

## 2015-04-15 NOTE — ED Notes (Signed)
Pt sent from [facility] assisted living for evaluation of vomiting.  Pt noted to be hypotensive and is currently being tx for UTI.

## 2015-04-15 NOTE — H&P (Signed)
[facility] History and Physical  Kylla Lewkowicz [id number] DOB: January 14, 1946 DOA: 04/15/2015  Referring physician: ED physician, Dr. Jeanell Sparrow PCP: Jani Gravel, MD   Chief Complaint: Nausea and vomiting.  HPI: Felicia Grant is a 70 y.o. female with a history of dementia, schizoaffective schizophrenia, COPD, and diabetes mellitus, who presents to the emergency department from [facility] assisted living facility with a complaint of nausea and vomiting. The patient does not know why she is here, but she does acknowledge vomiting earlier today. She also acknowledges pain when she urinates, lower abdominal pain, low back pain, and chronic pain in her legs. She is unable to give specifics, i.e., she does not recall the time of her last bowel movement, she does not recall if she had blood in her emesis. She denies chest pain and shortness of breath. She denies subjective fever and chills.  In the ED, on arrival, her blood pressure was 76/51. With a bolus of IV fluids, it improved to 126/55. She is oxygenating 93-100% on room air. She is febrile with a temperature 100.6. Her urinalysis is wholly infected appearing. Her lab data are significant for hemoglobin of 8.3, normal WBC, sodium of 134, CO2 of 19, BUN of 93, creatinine of 4.95, and glucose of 208. Her lactic acid was 2.41 on admission, but improved to 1.70. Her chest x-ray revealed low lung volumes and central venous congestion, but no acute findings. She is being admitted for further evaluation and management.   Review of Systems:  As above in history present illness. Patient also complains of chronic low back pain, chronic neck pain, chronic pain in her legs. She also has difficulty walking due to pain in her legs. Otherwise review of systems is negative.  Past Medical History  Diagnosis Date  . Dementia   . Bradycardia   . Hypertension   . Diabetes mellitus without complication ([place])   . Parkinson's disease ([place])   . Schizo  affective schizophrenia ([place])   . COPD (chronic obstructive pulmonary disease) ([place])   . Cardiac pacemaker in situ    Past Surgical History  Procedure Laterality Date  . Hip surgery     Social History: She says that she was married. She has "many children". She is a resident of [facility] assisted living facility. She reports that she has never smoked. She does not have any smokeless tobacco history on file. She reports that she does not drink alcohol. Her drug history is not on file.  No Known Allergies  Family history: She does not recall what her parents died of. She cannot recall family history of any chronic diseases.  Prior to Admission medications   Medication Sig Start Date End Date Taking? Authorizing Provider  acetaminophen (TYLENOL) 325 MG tablet Take 650 mg by mouth every 6 (six) hours as needed for mild pain or moderate pain.   Yes Historical Provider, MD  amLODipine (NORVASC) 5 MG tablet Take 5 mg by mouth daily.   Yes Historical Provider, MD  aspirin EC 81 MG tablet Take 81 mg by mouth daily.   Yes Historical Provider, MD  cetirizine (ZYRTEC) 10 MG tablet Take 10 mg by mouth daily.   Yes Historical Provider, MD  cholecalciferol (VITAMIN D) 400 units TABS tablet Take 400 Units by mouth daily.   Yes Historical Provider, MD  docusate sodium (COLACE) 100 MG capsule Take 100 mg by mouth 2 (two) times daily as needed for mild constipation.   Yes Historical Provider, MD  ferrous sulfate 325 (65  FE) MG tablet Take 325 mg by mouth at bedtime.   Yes Historical Provider, MD  lisinopril (PRINIVIL,ZESTRIL) 40 MG tablet Take 40 mg by mouth daily.   Yes Historical Provider, MD  metFORMIN (GLUCOPHAGE) 500 MG tablet Take 500-1,000 mg by mouth 2 (two) times daily with a meal. 2 tablets every morning and 1 tablet every evening   Yes Historical Provider, MD  nystatin cream (MYCOSTATIN) Apply 1 application topically 2 (two) times daily.   Yes Historical Provider, MD  risperiDONE (RISPERDAL) 1 MG  tablet Take 1 mg by mouth at bedtime.   Yes Historical Provider, MD  senna-docusate (TGT SENNA LAX/STOOL SOFTENER) 8.6-50 MG tablet Take 1 tablet by mouth daily.   Yes Historical Provider, MD  simvastatin (ZOCOR) 40 MG tablet Take 40 mg by mouth at bedtime.   Yes Historical Provider, MD  tolterodine (DETROL LA) 4 MG 24 hr capsule Take 4 mg by mouth daily.   Yes Historical Provider, MD   Physical Exam: Filed Vitals:   04/15/15 1630 04/15/15 1700 04/15/15 1730 04/15/15 1800  BP: 113/51 106/39 106/36 110/70  Pulse:  93    Temp:      TempSrc:      Resp: 29 23 20 17   Height:      Weight:      SpO2:  100%      Wt Readings from Last 3 Encounters:  04/15/15 90.719 kg (200 lb)    General:  Obese African-American woman in no acute distress, but she appears ill. Eyes: PERRL, normal lids, conjunctivae are clear and sclerae are white. ENT: grossly normal hearing; oropharyngeal mucous membranes are dry. Neck: no LAD, masses or thyromegaly Cardiovascular: S1, S2, with a 1 to 2/6 systolic murmur. Trace of pedal edema. Telemetry: SR, no arrhythmias  Respiratory: Decreased breath sounds in the bases and clear anteriorly. Breathing is nonlabored. Abdomen: Hypoactive bowel sounds, soft, mildly distended over the lower abdomen/hypogastrium; moderately to exquisitely tender; mass  not palpated due to patient moving my hand away during the examination. Skin: no rash or induration seen on limited exam Musculoskeletal: grossly normal tone BUE/BLE; no acute hot red joints, but noted hypertrophic changes in her knees and her hands from chronic arthritis. Psychiatric: She is alert and oriented to herself, but she is not oriented to the hospital, time, or year. Her speech is relatively clear. She has a flat affect. Neurologic: She has some lip smacking, consistent with mild tardive dyskinesia; cranial nerves II through XII appear to be grossly intact.           Labs on Admission:  Basic Metabolic  Panel:  Recent Labs Lab 04/15/15 1106  NA 134*  K 5.1  CL 103  CO2 19*  GLUCOSE 208*  BUN 93*  CREATININE 4.95*  CALCIUM 8.5*   Liver Function Tests:  Recent Labs Lab 04/15/15 1106  AST 22  ALT 10*  ALKPHOS 66  BILITOT 0.5  PROT 6.2*  ALBUMIN 3.1*   No results for input(s): LIPASE, AMYLASE in the last 168 hours. No results for input(s): AMMONIA in the last 168 hours. CBC:  Recent Labs Lab 04/15/15 1106  WBC 4.6  NEUTROABS 2.2  HGB 8.3*  HCT 27.3*  MCV 71.8*  PLT 198   Cardiac Enzymes: No results for input(s): CKTOTAL, CKMB, CKMBINDEX, TROPONINI in the last 168 hours.  BNP (last 3 results) No results for input(s): BNP in the last 8760 hours.  ProBNP (last 3 results) No results for input(s): PROBNP in the last  8760 hours.  CBG: No results for input(s): GLUCAP in the last 168 hours.  Radiological Exams on Admission: Dg Chest 2 View  04/15/2015  CLINICAL DATA:  Pt sent from assisted living for of vomiting. hypotensive and is currently being tx for UTI. EXAM: CHEST  2 VIEW COMPARISON:  None. FINDINGS: LEFT-sided pacemaker overlies normal cardiac silhouette. Low lung volumes. Central venous congestion. No focal infiltrate or pneumothorax. IMPRESSION: Low lung volumes and central venous congestion.  No acute findings. Electronically Signed   By: Suzy Bouchard M.D.   On: 04/15/2015 14:40    EKG: Independently reviewed.   Assessment/Plan Principal Problem:   Acute pyelonephritis Active Problems:   Sepsis ([place])   Lower abdominal pain   Hypotension arterial   Nausea and vomiting   Lactic acidosis   Diabetes mellitus with complication (HCC)   Metabolic acidosis   Schizoaffective schizophrenia (HCC)   COPD (chronic obstructive pulmonary disease) (HCC)   Parkinson's disease (HCC)   Microcytic anemia   Cardiac pacemaker in situ   1. Patient's presentation is consistent with acute pyelonephritis causing sepsis and lactic acidosis. She does have  significant lower abdominal pain, so another intra-abdominal etiology could be contributing. Her nausea and vomiting are likely from the infection. Her blood pressure improved with IV fluids and her lactic acidosis cleared following IV fluids and IV antibiotics. Urine culture and blood cultures were ordered in the ED. 2. We'll continue treatment with vancomycin and Zosyn empirically. We'll narrow antibiotics once cultures are resulted. 3. Her baseline creatinine is unknown. The last recorded creatinine in the chart was in 2013 and it was within normal limits. The acute kidney failure may be secondary to volume depletion/prerenal azotemia and/or ATN. Will treat with vigorous IV fluids. Will order Foley catheter insertion for strict ins and outs. 4. Continue vigorous IV fluids. Will make her virtually nothing by mouth with exception of sips of clear liquids and ice chips as tolerated. 5. We'll start scheduled Zofran every 6 hours. Will start IV Protonix daily. 6. Will hold metformin. Will treat her diabetes with sliding scale NovoLog. 7. We'll order an anemia panel and TSH to assess her anemia. Will type and screen her as it is anticipated that her hemoglobin will fall following vigorous IV fluids. 8. She has a recorded history of Parkinson's disease, dementia, and schizophrenia. I do not see any parkinsonian medications on her medication list. We'll continue Risperdal daily at bedtime as tolerated orally. All of her other chronic medications are being held. The patient does appear to have some tardive dyskinesia changes. We'll continue to monitor to see if Cogentin is needed.   Code Status: Not relying on patient to give me her CODE STATUS. She appears to be full code as there are is not an outpatient DO NOT RESUSCITATE. DVT Prophylaxis: SCDs Family Communication: Family not available Disposition Plan: Anticipate discharge back to assisted living facility in a few days when clinically  appropriate.  Time spent: One hour and 10 minutes.  [facility] Pager [phone]

## 2015-04-15 NOTE — ED Notes (Signed)
Cath UA attempted by myself and Deanna Mabe NT.  Unable to visualize urethra and attempts discontinued.

## 2015-04-15 NOTE — ED Provider Notes (Addendum)
CSN: [id number]     Arrival date & time 04/15/15  1039 History  By signing my name below, I, Felicia Grant, attest that this documentation has been prepared under the direction and in the presence of Felicia Boss, MD. Electronically Signed: Eustaquio Grant, ED Scribe. 04/15/2015. 11:12 AM.   Chief Complaint  Patient presents with  . Code Sepsis   LEVEL 5 CAVEAT for dementia  No language interpreter was used.     HPI Comments: Felicia Grant is a 70 y.o. female brought in by ambulance, with PMHx dementia, HTN, DM, parkinson's disease, schizophrenia, and COPD who presents to the Emergency Department for vomiting. Pt was sent to the ED from [facility] for vomiting. Pt is currently being treated for a UTI. Pt does admit to abdominal pain. She is unable to provide any other details.   Past Medical History  Diagnosis Date  . Dementia   . Bradycardia   . Hypertension   . Diabetes mellitus without complication ([place])   . Parkinson's disease ([place])   . Schizo affective schizophrenia ([place])   . COPD (chronic obstructive pulmonary disease) [facility])    Past Surgical History  Procedure Laterality Date  . Hip surgery     History reviewed. No pertinent family history. Social History  Substance Use Topics  . Smoking status: Never Smoker   . Smokeless tobacco: None  . Alcohol Use: No   OB History    No data available     Review of Systems  Unable to perform ROS: Dementia      Allergies  Review of patient's allergies indicates no known allergies.  Home Medications   Prior to Admission medications   Medication Sig Start Date End Date Taking? Authorizing Provider  amLODipine (NORVASC) 5 MG tablet Take 5 mg by mouth daily.    Historical Provider, MD  aspirin EC 81 MG tablet Take 81 mg by mouth daily.    Historical Provider, MD  cetirizine (ZYRTEC) 10 MG tablet Take 10 mg by mouth daily.    Historical Provider, MD  cholecalciferol (VITAMIN D) 400 units TABS tablet Take  400 Units by mouth daily.    Historical Provider, MD  ferrous sulfate 325 (65 FE) MG tablet Take 325 mg by mouth at bedtime.    Historical Provider, MD  lisinopril (PRINIVIL,ZESTRIL) 40 MG tablet Take 40 mg by mouth daily.    Historical Provider, MD  metFORMIN (GLUCOPHAGE) 500 MG tablet Take 500-1,000 mg by mouth 2 (two) times daily with a meal. 2 tablets every morning and 1 tablet every evening    Historical Provider, MD  nystatin cream (MYCOSTATIN) Apply 1 application topically 2 (two) times daily.    Historical Provider, MD  risperiDONE (RISPERDAL) 1 MG tablet Take 1 mg by mouth at bedtime.    Historical Provider, MD  senna (SENOKOT) 8.6 MG TABS tablet Take 1 tablet by mouth daily.    Historical Provider, MD  simvastatin (ZOCOR) 40 MG tablet Take 40 mg by mouth at bedtime.    Historical Provider, MD  tolterodine (DETROL LA) 4 MG 24 hr capsule Take 4 mg by mouth daily.    Historical Provider, MD   BP 76/51 mmHg  Pulse 99  Temp(Src) 98.7 F (37.1 C) (Oral)  Resp 18  Ht 5\' 4"  (1.626 m)  Wt 200 lb (90.719 kg)  BMI 34.31 kg/m2  SpO2 100%   Physical Exam  ED Course  Procedures (including critical care time)  DIAGNOSTIC STUDIES: Oxygen Saturation is 100% on  RA, normal by my interpretation.    COORDINATION OF CARE: 11:10 AM-  treatment plan includes lactic acid, CMP, CBC with differential, blood culture, UA, and urine culture with pt at bedside and pt agreed to plan.   11:27 AM - Nurse spoke with staff at [facility] normally walks and talks on her own but is pleasantly demented. The past 4 days pt has been declining with vomiting. Pt began having a fever yesterday. She has had 2 episodes of diarrhea 2 days ago. Pt was recently treated for UTI with Cipro but finished on 03/31/2015. Pt has been on prophylactic antibiotics for recurrent UTIs in the past but was not re-started on it after finishing Cipro.   12:35 PM - Upon reevaluation, pt's blood pressure is better.   Labs  Review Labs Reviewed  COMPREHENSIVE METABOLIC PANEL - Abnormal; Notable for the following:    Sodium 134 (*)    CO2 19 (*)    Glucose, Bld 208 (*)    BUN 93 (*)    Creatinine, Ser 4.95 (*)    Calcium 8.5 (*)    Total Protein 6.2 (*)    Albumin 3.1 (*)    ALT 10 (*)    GFR calc non Af Amer 8 (*)    GFR calc Af Amer 9 (*)    All other components within normal limits  CBC WITH DIFFERENTIAL/PLATELET - Abnormal; Notable for the following:    RBC 3.80 (*)    Hemoglobin 8.3 (*)    HCT 27.3 (*)    MCV 71.8 (*)    MCH 21.8 (*)    RDW 19.3 (*)    Monocytes Absolute 1.6 (*)    All other components within normal limits  I-STAT CG4 LACTIC ACID, ED - Abnormal; Notable for the following:    Lactic Acid, Venous 2.41 (*)    All other components within normal limits  CULTURE, BLOOD (ROUTINE X 2)  CULTURE, BLOOD (ROUTINE X 2)  URINE CULTURE  URINALYSIS, ROUTINE W REFLEX MICROSCOPIC (NOT AT [facility])  PATHOLOGIST SMEAR REVIEW  I-STAT CG4 LACTIC ACID, ED    Imaging Review No results found. I have personally reviewed and evaluated these lab results as part of my medical decision-making.   EKG Interpretation None      MDM   Final diagnoses:  Hypotension, unspecified hypotension type   70 [place].o. Female from [place] presents with report of fever, ams, and hypotension.  Patient evaluated here and sepsis protocol initiated.  Patient initially hypotensive but has responded well to iv fluids.  IV abx initiated per sepsis protocol for unknown site of infection.  1- hypotension- responded well to fluids.  Possible source volume depletion vs infection.  Repeat lactic acid pending.  2- kidney failure- creatinine at 4.95 with bun 93- ? Mixed pre and renal azotemia.Last system creatinine normal in 2013  3- anemia- last hgb in sysytem 2013 and normal. Will check hemocult. 4- ?uti- initial specimen on bedpan- plan foley and resend.   Discussed with Dr. Caryn Section and plan admission to icu.  I personally  performed the services described in this documentation, which was scribed in my presence. The recorded information has been reviewed and considered.    Felicia Boss, MD 04/15/15 1442  CRITICAL CARE Performed by: Shaune Pollack Total critical care time: 65 minutes Critical care time was exclusive of separately billable procedures and treating other patients. Critical care was necessary to treat or prevent imminent or life-threatening deterioration. Critical care was time spent personally  by me on the following activities: development of treatment plan with patient and/or surrogate as well as nursing, discussions with consultants, evaluation of patient's response to treatment, examination of patient, obtaining history from patient or surrogate, ordering and performing treatments and interventions, ordering and review of laboratory studies, ordering and review of radiographic studies, pulse oximetry and re-evaluation of patient's condition.   Felicia Boss, MD 04/15/15 1444 Sepsis - Repeat Assessment  Performed at:    2:45 PM   Vitals     Blood pressure 115/53, pulse 87, temperature 98 F (36.7 C), temperature source Oral, resp. rate 21, height 5\' 4"  (1.626 m), weight 90.719 kg, SpO2 95 %.  Heart:     Regular rate and rhythm  Lungs:    Rhonchi  Capillary Refill:   <2 sec  Peripheral Pulse:   Radial pulse palpable  Skin:     Pale    Felicia Boss, MD 04/15/15 1445

## 2015-04-15 NOTE — ED Notes (Signed)
Pt is now awake and talking.  Pleasantly confused which is baseline per [facility].

## 2015-04-16 ENCOUNTER — Inpatient Hospital Stay (HOSPITAL_COMMUNITY): Payer: Medicare Other

## 2015-04-16 DIAGNOSIS — Z466 Encounter for fitting and adjustment of urinary device: Secondary | ICD-10-CM

## 2015-04-16 DIAGNOSIS — R338 Other retention of urine: Secondary | ICD-10-CM

## 2015-04-16 DIAGNOSIS — N3289 Other specified disorders of bladder: Secondary | ICD-10-CM | POA: Diagnosis present

## 2015-04-16 DIAGNOSIS — N131 Hydronephrosis with ureteral stricture, not elsewhere classified: Secondary | ICD-10-CM

## 2015-04-16 DIAGNOSIS — N1330 Unspecified hydronephrosis: Secondary | ICD-10-CM | POA: Diagnosis present

## 2015-04-16 DIAGNOSIS — R1030 Lower abdominal pain, unspecified: Secondary | ICD-10-CM

## 2015-04-16 DIAGNOSIS — N179 Acute kidney failure, unspecified: Secondary | ICD-10-CM

## 2015-04-16 DIAGNOSIS — N12 Tubulo-interstitial nephritis, not specified as acute or chronic: Secondary | ICD-10-CM

## 2015-04-16 LAB — COMPREHENSIVE METABOLIC PANEL
ALK PHOS: 61 U/L (ref 38–126)
ALT: 12 U/L — AB (ref 14–54)
AST: 22 U/L (ref 15–41)
Albumin: 2.7 g/dL — ABNORMAL LOW (ref 3.5–5.0)
Anion gap: 14 (ref 5–15)
BUN: 87 mg/dL — ABNORMAL HIGH (ref 6–20)
CALCIUM: 8.2 mg/dL — AB (ref 8.9–10.3)
CO2: 15 mmol/L — AB (ref 22–32)
CREATININE: 4.39 mg/dL — AB (ref 0.44–1.00)
Chloride: 110 mmol/L (ref 101–111)
GFR, EST AFRICAN AMERICAN: 11 mL/min — AB (ref >60)
GFR, EST NON AFRICAN AMERICAN: 9 mL/min — AB (ref >60)
Glucose, Bld: 188 mg/dL — ABNORMAL HIGH (ref 65–99)
Potassium: 4.9 mmol/L (ref 3.5–5.1)
Sodium: 139 mmol/L (ref 135–145)
Total Bilirubin: 0.6 mg/dL (ref 0.3–1.2)
Total Protein: 5.7 g/dL — ABNORMAL LOW (ref 6.5–8.1)

## 2015-04-16 LAB — CBC
HCT: 27.4 % — ABNORMAL LOW (ref 36.0–46.0)
Hemoglobin: 7.9 g/dL — ABNORMAL LOW (ref 12.0–15.0)
MCH: 21.2 pg — AB (ref 26.0–34.0)
MCHC: 28.8 g/dL — ABNORMAL LOW (ref 30.0–36.0)
MCV: 73.5 fL — ABNORMAL LOW (ref 78.0–100.0)
PLATELETS: 192 10*3/uL (ref 150–400)
RBC: 3.73 MIL/uL — AB (ref 3.87–5.11)
RDW: 19.4 % — AB (ref 11.5–15.5)
WBC: 4.3 10*3/uL (ref 4.0–10.5)

## 2015-04-16 LAB — MRSA PCR SCREENING: MRSA BY PCR: NEGATIVE

## 2015-04-16 LAB — FERRITIN: Ferritin: 28 ng/mL (ref 11–307)

## 2015-04-16 LAB — PATHOLOGIST SMEAR REVIEW

## 2015-04-16 LAB — GLUCOSE, CAPILLARY
GLUCOSE-CAPILLARY: 153 mg/dL — AB (ref 65–99)
GLUCOSE-CAPILLARY: 156 mg/dL — AB (ref 65–99)
GLUCOSE-CAPILLARY: 255 mg/dL — AB (ref 65–99)
Glucose-Capillary: 153 mg/dL — ABNORMAL HIGH (ref 65–99)
Glucose-Capillary: 209 mg/dL — ABNORMAL HIGH (ref 65–99)

## 2015-04-16 LAB — FOLATE: Folate: 10.8 ng/mL (ref >5.9)

## 2015-04-16 LAB — IRON AND TIBC
Iron: 5 ug/dL — ABNORMAL LOW (ref 28–170)
Saturation Ratios: 2 % — ABNORMAL LOW (ref 10.4–31.8)
TIBC: 269 ug/dL (ref 250–450)
UIBC: 264 ug/dL

## 2015-04-16 LAB — HEMOGLOBIN A1C
Hgb A1c MFr Bld: 6.8 % — ABNORMAL HIGH (ref 4.8–5.6)
Mean Plasma Glucose: 148 mg/dL

## 2015-04-16 LAB — VITAMIN B12: VITAMIN B 12: 187 pg/mL (ref 180–914)

## 2015-04-16 MED ORDER — VANCOMYCIN HCL IN DEXTROSE 1-5 GM/200ML-% IV SOLN
1000.0000 mg | INTRAVENOUS | Status: DC
  Administered 2015-04-17: 1000 mg via INTRAVENOUS
  Filled 2015-04-16: qty 200

## 2015-04-16 MED ORDER — CETYLPYRIDINIUM CHLORIDE 0.05 % MT LIQD
7.0000 mL | Freq: Two times a day (BID) | OROMUCOSAL | Status: DC
  Administered 2015-04-16 – 2015-04-21 (×11): 7 mL via OROMUCOSAL

## 2015-04-16 MED ORDER — SODIUM BICARBONATE 8.4 % IV SOLN
INTRAVENOUS | Status: DC
  Administered 2015-04-16 – 2015-04-18 (×4): via INTRAVENOUS
  Filled 2015-04-16 (×8): qty 1000

## 2015-04-16 MED ORDER — PIPERACILLIN SOD-TAZOBACTAM SO 2.25 (2-0.25) G IV SOLR
2.2500 g | Freq: Three times a day (TID) | INTRAVENOUS | Status: DC
  Administered 2015-04-16 – 2015-04-18 (×7): 2.25 g via INTRAVENOUS
  Filled 2015-04-16 (×17): qty 2.25

## 2015-04-16 NOTE — Consult Note (Signed)
Urology Consult  Referring physician: Dr. Caryn Section Reason for referral: difficult foley placement, urinary retention  Chief Complaint: pelvic pain  History of Present Illness: Felicia Grant is a 70yo with a hx of dementia and schizophrenia admitted with pyelonephritis, acute renal failure, and nausea/vomiting. Creatinine was 4 on admission and per patient she has been having difficulty emptying her bladder for over 6 months. She has urgency, frequency, dysuria. No hematuria. She underwent CT scan which showed a severely distended bladder with bilateral hydronephrosis. Multiple attempts to place a foley catheter were made by the nursing staff which were unsuccessful.  Past Medical History  Diagnosis Date  . Dementia   . Bradycardia   . Hypertension   . Diabetes mellitus without complication ([place])   . Parkinson's disease ([place])   . Schizo affective schizophrenia ([place])   . COPD (chronic obstructive pulmonary disease) ([place])   . Cardiac pacemaker in situ    Past Surgical History  Procedure Laterality Date  . Hip surgery      Medications: I have reviewed the patient's current medications. Allergies: No Known Allergies  History reviewed. No pertinent family history. Social History:  reports that she has never smoked. She does not have any smokeless tobacco history on file. She reports that she does not drink alcohol. Her drug history is not on file.  Review of Systems  Constitutional: Positive for malaise/fatigue.  Gastrointestinal: Positive for nausea, vomiting and abdominal pain.  Genitourinary: Positive for dysuria, urgency, frequency and hematuria.  All other systems reviewed and are negative.   Physical Exam:  Vital signs in last 24 hours: Temp:  [97 F (36.1 C)-98.8 F (37.1 C)] 97.8 F (36.6 C) (03/02 2000) Pulse Rate:  [38-96] 84 (03/02 2000) Resp:  [14-27] 18 (03/02 2000) BP: (95-141)/(48-83) 109/60 mmHg (03/02 2000) SpO2:  [64 %-100 %] 99 % (03/02 2000) Weight:  [87.7 kg  (193 lb 5.5 oz)-88.8 kg (195 lb 12.3 oz)] 88.8 kg (195 lb 12.3 oz) (03/02 0500) Physical Exam  Constitutional: She is oriented to person, place, and time. She appears well-developed and well-nourished.  HENT:  Head: Normocephalic and atraumatic.  Eyes: EOM are normal. Pupils are equal, round, and reactive to light.  Neck: Normal range of motion. No thyromegaly present.  Cardiovascular: Normal rate and regular rhythm.   Respiratory: Effort normal and breath sounds normal.  GI: Soft. She exhibits no distension and no mass. There is tenderness. There is no rebound and no guarding.  Genitourinary: There is no rash, tenderness or lesion on the right labia. There is no rash, tenderness or lesion on the left labia. There is bleeding in the vagina.  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  Neurological: She is alert and oriented to person, place, and time.  Skin: Skin is warm and dry.  Psychiatric: She has a normal mood and affect. Her behavior is normal. Judgment and thought content normal.    Laboratory Data:  Results for orders placed or performed during the hospital encounter of 04/15/15 (from the past 72 hour(s))  Comprehensive metabolic panel     Status: Abnormal   Collection Time: 04/15/15 11:06 AM  Result Value Ref Range   Sodium 134 (L) 135 - 145 mmol/L   Potassium 5.1 3.5 - 5.1 mmol/L   Chloride 103 101 - 111 mmol/L   CO2 19 (L) 22 - 32 mmol/L   Glucose, Bld 208 (H) 65 - 99 mg/dL   BUN  93 (H) 6 - 20 mg/dL   Creatinine, Ser 4.95 (H) 0.44 - 1.00 mg/dL   Calcium 8.5 (L) 8.9 - 10.3 mg/dL   Total Protein 6.2 (L) 6.5 - 8.1 g/dL   Albumin 3.1 (L) 3.5 - 5.0 g/dL   AST 22 15 - 41 U/L   ALT 10 (L) 14 - 54 U/L   Alkaline Phosphatase 66 38 - 126 U/L   Total Bilirubin 0.5 0.3 - 1.2 mg/dL   GFR calc non Af Amer 8 (L) >60 mL/min   GFR calc Af Amer 9 (L) >60 mL/min    Comment: (NOTE) The  eGFR has been calculated using the CKD EPI equation. This calculation has not been validated in all clinical situations. eGFR's persistently <60 mL/min signify possible Chronic Kidney Disease.    Anion gap 12 5 - 15  CBC WITH DIFFERENTIAL     Status: Abnormal   Collection Time: 04/15/15 11:06 AM  Result Value Ref Range   WBC 4.6 4.0 - 10.5 K/uL   RBC 3.80 (L) 3.87 - 5.11 MIL/uL   Hemoglobin 8.3 (L) 12.0 - 15.0 g/dL   HCT 27.3 (L) 36.0 - 46.0 %   MCV 71.8 (L) 78.0 - 100.0 fL   MCH 21.8 (L) 26.0 - 34.0 pg   MCHC 30.4 30.0 - 36.0 g/dL   RDW 19.3 (H) 11.5 - 15.5 %   Platelets 198 150 - 400 K/uL   Neutrophils Relative % 47 %   Neutro Abs 2.2 1.7 - 7.7 K/uL   Lymphocytes Relative 19 %   Lymphs Abs 0.9 0.7 - 4.0 K/uL   Monocytes Relative 34 %   Monocytes Absolute 1.6 (H) 0.1 - 1.0 K/uL   Eosinophils Relative 0 %   Eosinophils Absolute 0.0 0.0 - 0.7 K/uL   Basophils Relative 0 %   Basophils Absolute 0.0 0.0 - 0.1 K/uL  TSH     Status: None   Collection Time: 04/15/15 11:06 AM  Result Value Ref Range   TSH 1.131 0.350 - 4.500 uIU/mL  Hemoglobin A1c     Status: Abnormal   Collection Time: 04/15/15 11:06 AM  Result Value Ref Range   Hgb A1c MFr Bld 6.8 (H) 4.8 - 5.6 %    Comment: (NOTE)         Pre-diabetes: 5.7 - 6.4         Diabetes: >6.4         Glycemic control for adults with diabetes: <7.0    Mean Plasma Glucose 148 mg/dL    Comment: (NOTE) Performed At: [facility] [place], [place] [id number] Lindon Romp MD [id number]   Blood Culture (routine x 2)     Status: None (Preliminary result)   Collection Time: 04/15/15 11:07 AM  Result Value Ref Range   Specimen Description BLOOD RIGHT HAND DRAWN BY RN    Special Requests BOTTLES DRAWN AEROBIC ONLY 6CC    Culture NO GROWTH 1 DAY    Report Status PENDING   Pathologist smear review     Status: None   Collection Time: 04/15/15 11:07 AM  Result Value Ref Range   Path Review Reviewed By Violet Baldy, M.D.     Comment: 03.02.17 MICROCYTIC ANEMIA AND MONOCYTOSIS. Performed at [facility]   Blood Culture (routine x 2)     Status: None (Preliminary result)   Collection Time: 04/15/15 11:08 AM  Result Value Ref Range   Specimen Description BLOOD RIGHT ANTECUBITAL DRAWN BY  RN    Special Requests BOTTLES DRAWN AEROBIC ONLY 5CC    Culture NO GROWTH 1 DAY    Report Status PENDING   I-Stat CG4 Lactic Acid, ED  (not at  [facility])     Status: Abnormal   Collection Time: 04/15/15 11:25 AM  Result Value Ref Range   Lactic Acid, Venous 2.41 (HH) 0.5 - 2.0 mmol/L  Urinalysis, Routine w reflex microscopic (not at [facility])     Status: Abnormal   Collection Time: 04/15/15  1:08 PM  Result Value Ref Range   Color, Urine BROWN (A) YELLOW    Comment: BIOCHEMICALS MAY BE AFFECTED BY COLOR   APPearance HAZY (A) CLEAR   Specific Gravity, Urine 1.015 1.005 - 1.030   pH 8.5 (H) 5.0 - 8.0   Glucose, UA 100 (A) NEGATIVE mg/dL   Hgb urine dipstick LARGE (A) NEGATIVE   Bilirubin Urine SMALL (A) NEGATIVE   Ketones, ur NEGATIVE NEGATIVE mg/dL   Protein, ur >300 (A) NEGATIVE mg/dL   Nitrite POSITIVE (A) NEGATIVE   Leukocytes, UA MODERATE (A) NEGATIVE  Urine microscopic-add on     Status: Abnormal   Collection Time: 04/15/15  1:08 PM  Result Value Ref Range   Squamous Epithelial / LPF 0-5 (A) NONE SEEN   WBC, UA 6-30 0 - 5 WBC/hpf   RBC / HPF TOO NUMEROUS TO COUNT 0 - 5 RBC/hpf   Bacteria, UA MANY (A) NONE SEEN  I-Stat CG4 Lactic Acid, ED  (not at  [facility])     Status: None   Collection Time: 04/15/15  1:59 PM  Result Value Ref Range   Lactic Acid, Venous 1.70 0.5 - 2.0 mmol/L  Vitamin B12     Status: None   Collection Time: 04/15/15  7:15 PM  Result Value Ref Range   Vitamin B-12 187 180 - 914 pg/mL    Comment: (NOTE) This assay is not validated for testing neonatal or myeloproliferative syndrome specimens for Vitamin B12 levels. Performed at [facility]   Folate      Status: None   Collection Time: 04/15/15  7:15 PM  Result Value Ref Range   Folate 10.8 >5.9 ng/mL    Comment: Performed at [facility]  Iron and TIBC     Status: Abnormal   Collection Time: 04/15/15  7:15 PM  Result Value Ref Range   Iron 5 (L) 28 - 170 ug/dL   TIBC 269 250 - 450 ug/dL   Saturation Ratios 2 (L) 10.4 - 31.8 %   UIBC 264 ug/dL    Comment: Performed at [facility]  Ferritin     Status: None   Collection Time: 04/15/15  7:15 PM  Result Value Ref Range   Ferritin 28 11 - 307 ng/mL    Comment: Performed at [facility]  Reticulocytes     Status: Abnormal   Collection Time: 04/15/15  7:15 PM  Result Value Ref Range   Retic Ct Pct 0.5 0.4 - 3.1 %   RBC. 3.61 (L) 3.87 - 5.11 MIL/uL   Retic Count, Manual 18.1 (L) 19.0 - 186.0 K/uL  Type and screen [facility]     Status: None   Collection Time: 04/15/15  7:15 PM  Result Value Ref Range   ABO/RH(D) O POS    Antibody Screen NEG    Sample Expiration 04/18/2015   Glucose, capillary     Status: Abnormal   Collection Time: 04/16/15 12:22 AM  Result Value Ref  Range   Glucose-Capillary 153 (H) 65 - 99 mg/dL  MRSA PCR Screening     Status: None   Collection Time: 04/16/15  3:30 AM  Result Value Ref Range   MRSA by PCR NEGATIVE NEGATIVE    Comment:        The GeneXpert MRSA Assay (FDA approved for NASAL specimens only), is one component of a comprehensive MRSA colonization surveillance program. It is not intended to diagnose MRSA infection nor to guide or monitor treatment for MRSA infections.   Comprehensive metabolic panel     Status: Abnormal   Collection Time: 04/16/15  5:18 AM  Result Value Ref Range   Sodium 139 135 - 145 mmol/L   Potassium 4.9 3.5 - 5.1 mmol/L   Chloride 110 101 - 111 mmol/L   CO2 15 (L) 22 - 32 mmol/L   Glucose, Bld 188 (H) 65 - 99 mg/dL   BUN 87 (H) 6 - 20 mg/dL   Creatinine, Ser 4.39 (H) 0.44 - 1.00 mg/dL   Calcium 8.2 (L) 8.9 - 10.3 mg/dL   Total  Protein 5.7 (L) 6.5 - 8.1 g/dL   Albumin 2.7 (L) 3.5 - 5.0 g/dL   AST 22 15 - 41 U/L   ALT 12 (L) 14 - 54 U/L   Alkaline Phosphatase 61 38 - 126 U/L   Total Bilirubin 0.6 0.3 - 1.2 mg/dL   GFR calc non Af Amer 9 (L) >60 mL/min   GFR calc Af Amer 11 (L) >60 mL/min    Comment: (NOTE) The eGFR has been calculated using the CKD EPI equation. This calculation has not been validated in all clinical situations. eGFR's persistently <60 mL/min signify possible Chronic Kidney Disease.    Anion gap 14 5 - 15  CBC     Status: Abnormal   Collection Time: 04/16/15  5:18 AM  Result Value Ref Range   WBC 4.3 4.0 - 10.5 K/uL   RBC 3.73 (L) 3.87 - 5.11 MIL/uL   Hemoglobin 7.9 (L) 12.0 - 15.0 g/dL   HCT 27.4 (L) 36.0 - 46.0 %   MCV 73.5 (L) 78.0 - 100.0 fL   MCH 21.2 (L) 26.0 - 34.0 pg   MCHC 28.8 (L) 30.0 - 36.0 g/dL   RDW 19.4 (H) 11.5 - 15.5 %   Platelets 192 150 - 400 K/uL    Comment: SPECIMEN CHECKED FOR CLOTS CONSISTENT WITH PREVIOUS RESULT   Glucose, capillary     Status: Abnormal   Collection Time: 04/16/15  7:42 AM  Result Value Ref Range   Glucose-Capillary 156 (H) 65 - 99 mg/dL   Comment 1 Notify RN    Comment 2 Document in Chart   Glucose, capillary     Status: Abnormal   Collection Time: 04/16/15 11:22 AM  Result Value Ref Range   Glucose-Capillary 153 (H) 65 - 99 mg/dL   Comment 1 Notify RN    Comment 2 Document in Chart   Glucose, capillary     Status: Abnormal   Collection Time: 04/16/15  4:20 PM  Result Value Ref Range   Glucose-Capillary 209 (H) 65 - 99 mg/dL   Comment 1 Notify RN    Comment 2 Document in Chart    Recent Results (from the past 240 hour(s))  Blood Culture (routine x 2)     Status: None (Preliminary result)   Collection Time: 04/15/15 11:07 AM  Result Value Ref Range Status   Specimen Description BLOOD RIGHT HAND DRAWN BY RN  Final  Special Requests BOTTLES DRAWN AEROBIC ONLY 6CC  Final   Culture NO GROWTH 1 DAY  Final   Report Status PENDING   Incomplete  Blood Culture (routine x 2)     Status: None (Preliminary result)   Collection Time: 04/15/15 11:08 AM  Result Value Ref Range Status   Specimen Description BLOOD RIGHT ANTECUBITAL DRAWN BY RN  Final   Special Requests BOTTLES DRAWN AEROBIC ONLY 5CC  Final   Culture NO GROWTH 1 DAY  Final   Report Status PENDING  Incomplete  MRSA PCR Screening     Status: None   Collection Time: 04/16/15  3:30 AM  Result Value Ref Range Status   MRSA by PCR NEGATIVE NEGATIVE Final    Comment:        The GeneXpert MRSA Assay (FDA approved for NASAL specimens only), is one component of a comprehensive MRSA colonization surveillance program. It is not intended to diagnose MRSA infection nor to guide or monitor treatment for MRSA infections.    Creatinine:  Recent Labs  04/15/15 1106 04/16/15 0518  CREATININE 4.95* 4.39*   Baseline Creatinine: unknown  Impression/Assessment:  69yo with acute renal failure, urinary retention, hydronephrosis, gynecologic malignancy  Plan:  1. 16 french foley placed and catheter promptly drained 800cc of cloudy dark urine. Please continue foley catheter to straight drain. 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 Pt is at risk for post obstructive diuresis. Please perform serial BMP to ensure she does not develop electrolyte abnormalities. 3. 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

## 2015-04-16 NOTE — Progress Notes (Signed)
DR Maudry Mayhew, AT BEDSIDE . INSERTED  #16 FR FOLEY CATHETER W/ BLOODY AMBER URINE RETURNED. 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 r

## 2015-04-16 NOTE — Progress Notes (Addendum)
PT STATES SHE IS A JEHOVA'S WITNESS AND WILL NOT TAKE ANY BLOOD INFUSIONS. UNABLE TO INSERT FOLEY CATH D/T PERINEAL/URETHERAL MASS. HOWEVER PT HAS BEEN INCONTINENT OF LARGE AMT STRONG SMELLING YELLOW URINE X2 SINCE ARRIVAL TO ICU.

## 2015-04-16 NOTE — Progress Notes (Signed)
[facility] PROGRESS NOTE  Felicia Grant AVW:[id number] DOB: 1945/04/19 DOA: 04/15/2015 PCP: Jani Gravel, MD    Code Status: Full code Family Communication: Discussed with patient; family not available. Disposition Plan: Discharge back to ALF when clinically appropriate, likely in a few days.   Consultants:  Urology, pending  Nephrology pending  Procedures:  None  Antibiotics:  Vancomycin 04/15/15>>  Zosyn 04/15/15>>  HPI/Subjective: Patient has no complaints, but when asked specifically about her lower abdomen, she does went to pain and says that it hurts when examined. She denies nausea or vomiting. Nursing reports no vomiting. She denies chest pain or chest congestion.  Objective: Filed Vitals:   04/16/15 0600 04/16/15 0823  BP: 114/55   Pulse: 96   Temp:  98.8 F (37.1 C)  Resp: 18     Intake/Output Summary (Last 24 hours) at 04/16/15 1041 Last data filed at 04/16/15 0600  Gross per 24 hour  Intake   1200 ml  Output      0 ml  Net   1200 ml   Filed Weights   04/15/15 1048 04/15/15 2335 04/16/15 0500  Weight: 90.719 kg (200 lb) 87.7 kg (193 lb 5.5 oz) 88.8 kg (195 lb 12.3 oz)    Exam:   General:  Alert [name] in no acute distress. She looks better this morning.  Cardiovascular: S1, S2, with a soft systolic murmur.  Respiratory: Clear anteriorly with decreased breath sounds in the bases.  GU: on brief examination, there does not appear to be any type of urethral mass palpated or seen.  Abdomen: Moderately tender and distended over the lower abdomen and/suprapubic area. Upper abdomen with positive bowel sounds and no tenderness.  Musculoskeletal/extremities: Trace of pedal edema.  Neurologic: She is alert and oriented to herself, but not to the hospital. Otherwise, her speech is clear and she appears to answer questions appropriately.   Data Reviewed: Basic Metabolic Panel:  Recent Labs Lab 04/15/15 1106  04/16/15 0518  NA 134* 139  K 5.1 4.9  CL 103 110  CO2 19* 15*  GLUCOSE 208* 188*  BUN 93* 87*  CREATININE 4.95* 4.39*  CALCIUM 8.5* 8.2*   Liver Function Tests:  Recent Labs Lab 04/15/15 1106 04/16/15 0518  AST 22 22  ALT 10* 12*  ALKPHOS 66 61  BILITOT 0.5 0.6  PROT 6.2* 5.7*  ALBUMIN 3.1* 2.7*   No results for input(s): LIPASE, AMYLASE in the last 168 hours. No results for input(s): AMMONIA in the last 168 hours. CBC:  Recent Labs Lab 04/15/15 1106 04/16/15 0518  WBC 4.6 4.3  NEUTROABS 2.2  --   HGB 8.3* 7.9*  HCT 27.3* 27.4*  MCV 71.8* 73.5*  PLT 198 192   Cardiac Enzymes: No results for input(s): CKTOTAL, CKMB, CKMBINDEX, TROPONINI in the last 168 hours. BNP (last 3 results) No results for input(s): BNP in the last 8760 hours.  ProBNP (last 3 results) No results for input(s): PROBNP in the last 8760 hours.  CBG:  Recent Labs Lab 04/16/15 0022 04/16/15 0742  GLUCAP 153* 156*    Recent Results (from the past 240 hour(s))  Blood Culture (routine x 2)     Status: None (Preliminary result)   Collection Time: 04/15/15 11:07 AM  Result Value Ref Range Status   Specimen Description BLOOD RIGHT HAND DRAWN BY RN  Final   Special Requests BOTTLES DRAWN AEROBIC ONLY 6CC  Final   Culture NO GROWTH < 24 HOURS  Final   Report Status  PENDING  Incomplete  Blood Culture (routine x 2)     Status: None (Preliminary result)   Collection Time: 04/15/15 11:08 AM  Result Value Ref Range Status   Specimen Description BLOOD RIGHT ANTECUBITAL DRAWN BY RN  Final   Special Requests BOTTLES DRAWN AEROBIC ONLY 5CC  Final   Culture NO GROWTH < 24 HOURS  Final   Report Status PENDING  Incomplete  MRSA PCR Screening     Status: None   Collection Time: 04/16/15  3:30 AM  Result Value Ref Range Status   MRSA by PCR NEGATIVE NEGATIVE Final    Comment:        The GeneXpert MRSA Assay (FDA approved for NASAL specimens only), is one component of a comprehensive MRSA  colonization surveillance program. It is not intended to diagnose MRSA infection nor to guide or monitor treatment for MRSA infections.      Studies: Ct Abdomen Pelvis Wo Contrast  04/15/2015  CLINICAL DATA:  70 year old female inpatient with lower abdominal pain, nausea and vomiting. Dysuria. EXAM: CT ABDOMEN AND PELVIS WITHOUT CONTRAST TECHNIQUE: Multidetector CT imaging of the abdomen and pelvis was performed following the standard protocol without IV contrast. COMPARISON:  None. FINDINGS: Lower chest: No significant pulmonary nodules or acute consolidative airspace disease. There is a bulla/pneumatocele at the right lower lobe base. Pacer leads are seen terminating in the right atrium and right ventricular apex. Trace pericardial fluid/thickening. Coronary atherosclerosis. Nonspecific calcifications are present throughout both breasts. Hepatobiliary: Normal liver with no liver mass. Normal gallbladder with no radiopaque cholelithiasis. No biliary ductal dilatation. Pancreas: There is complete fatty replacement of the pancreas, with no pancreatic mass or appreciable pancreatic ductal dilatation. Spleen: Normal size. No mass. Adrenals/Urinary Tract: Mild diffuse thickening of both adrenal glands, without discrete adrenal nodule, suggesting mild adrenal hyperplasia. There is symmetric mild bilateral hydroureteronephrosis. No renal or ureteral stones. No contour deforming renal masses. A prominently distended urinary bladder with diffuse bladder wall thickening and prominent fat stranding surrounding the entire bladder wall. Stomach/Bowel: Grossly normal stomach. Normal caliber small bowel with no small bowel wall thickening. Appendix is not discretely visualized. Normal large bowel with no diverticulosis, large bowel wall thickening or pericolonic fat stranding. Vascular/Lymphatic: Atherosclerotic nonaneurysmal abdominal aorta. No pathologically enlarged lymph nodes in the abdomen or pelvis. Reproductive:  No adnexal mass. The region of the uterus is poorly visualized on this noncontrast study. Uterus could be atrophic or surgically absent. Other: No pneumoperitoneum, ascites or focal fluid collection. Musculoskeletal: No aggressive appearing focal osseous lesions. 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 Moderate degenerative changes in the visualized thoracolumbar spine. IMPRESSION: 1. Prominently distended urinary bladder. Mild bilateral hydroureteronephrosis. Diffuse bladder wall thickening and prominent perivesical fat stranding. Findings suggest acute infectious or inflammatory cystitis and bladder outlet obstruction. Urgent Foley catheter placement advised. 2. Additional findings include coronary atherosclerosis, mild adrenal hyperplasia and severe right hip osteoarthritis. These results were called by telephone at the time of interpretation on 04/15/2015 at 9:45 pm to Dr. Dayna Barker, who verbally acknowledged these results. Electronically Signed   By: Ilona Sorrel M.D.   On: 04/15/2015 21:49   Dg Chest 2 View  04/15/2015  CLINICAL DATA:  Pt sent from assisted living for of vomiting. hypotensive and is currently being tx for UTI. EXAM: CHEST  2 VIEW COMPARISON:  None. FINDINGS: LEFT-sided pacemaker overlies normal cardiac silhouette. Low lung volumes. Central venous congestion. No focal infiltrate or pneumothorax. IMPRESSION: Low lung volumes and central venous congestion.  No acute  findings. Electronically Signed   By: Suzy Bouchard M.D.   On: 04/15/2015 14:40    Scheduled Meds: . antiseptic oral rinse  7 mL Mouth Rinse BID  . insulin aspart  0-9 Units Subcutaneous TID WC  . ondansetron (ZOFRAN) IV  4 mg Intravenous 4 times per day  . pantoprazole (PROTONIX) IV  40 mg Intravenous Daily  . piperacillin-tazobactam (ZOSYN)  IV  2.25 g Intravenous Q8H  . risperiDONE  1 mg Oral QHS  . [START ON 04/17/2015] vancomycin  1,000 mg Intravenous Q48H   Continuous Infusions: . sodium chloride 150  mL/hr at 04/16/15 0600   Assessment and plan:  Principal Problem:   Acute pyelonephritis Active Problems:   Sepsis ([place])   Lower abdominal pain   Hypotension arterial   Nausea and vomiting   Distended bladder   Hydroureteronephrosis   Lactic acidosis   Diabetes mellitus with complication (HCC)   Metabolic acidosis   Schizoaffective schizophrenia (HCC)   COPD (chronic obstructive pulmonary disease) (HCC)   Parkinson's disease (HCC)   Microcytic anemia   Cardiac pacemaker in situ   1. Urinary tract infection/possible acute polynephritis causing sepsis and lactic acidosis. Patient's urinalysis on admission was wholly infected appearing. She was hypotensive on arrival to the ED. She was febrile. Blood cultures and a urine culture ordered. She was bolused IV fluids and started on vancomycin and Zosyn. -Her blood pressure is better. -Blood cultures and urine culture are negative to date.  Lower abdominal pain/distended abdomen/bilateral hydroureteronephrosis/? Bladder outlet obstruction CT scan of her abdomen and pelvis was ordered to evaluate for lower abdominal pain. Noncontrasted CT revealed prominently distended urinary bladder, mild bilateral hydroureteronephrosis and diffuse bladder wall thickening. Radiology suggested a differential diagnosis of acute infectious or inflammatory cystitis and bladder outlet obstruction. This is the likely etiology of her pain. -Foley catheter insertion was attempted by nursing overnight, but it was met with some resistance therefore it could not be inserted. Although the patient is urinating spontaneously, a recent bladder scan shows that she has greater than 999 cc of urine in her bladder. -We'll decrease IV fluids. The Detrol XL is being held.  [facility] consult urology urgently. -Renal ultrasound has been ordered and is pending.  Acute kidney injury. Patient's baseline creatinine is unknown, however, in 2013, her renal function was within normal  limits. The etiology could be multifactorial including sepsis, ATN, prerenal azotemia, or from bladder outlet obstruction. She was started on vigorous IV fluids. Her renal function has not improved. Due to incomplete bladder emptying, will decrease her IV fluids to avoid volume overload.  Metabolic acidosis. Her CO2 was 19, but has fallen to 15. The acidosis could be secondary to AKI or sepsis or from metformin. Her lactic acidosis was elevated on admission, but did normalize. Metformin was discontinued. -Will decrease IV fluids as stated above and change to add bicarbonate.  Microcytic anemia. Patient is treated with ferrous sulfate chronically. Her hemoglobin was 8.3 on admission. Her MCV was 72. With hydration, her hemoglobin has fallen to 7.8. Anemia panel was ordered and is pending. Her TSH was within normal limits. Will guaiac her stools. -No obvious source of bleeding. We'll hold on packed red blood cell transfusion. -We'll continue to monitor her CBC.  Essential hypertension. The patient was hypotensive on admission. She is treated chronically with lisinopril and amlodipine; both are on hold until her blood pressure consistently improved.  Type 2 diabetes mellitus. Patient is treated chronically with metformin. It is being held due to  acidosis. She was started on sliding scale NovoLog. Her CBGs so far have been reasonable. Her hemoglobin A1c is 8.0.  Schizoaffective disorder. Currently stable. We'll continue Risperdal.   Time spent: 40 minutes    [facility] Pager [phone]. If 7PM-7AM, please contact night-coverage at [email or web address], password [facility] 04/16/2015, 10:41 AM  LOS: 1 day

## 2015-04-16 NOTE — Progress Notes (Signed)
Attempted to place foley catheter, but was unable to advance it.  Resistance met shortly after entering the meatus, and the catheter coiled without progressing to the bladder.  Pt still leaking dark colored urine.  HR 81 with V-pacing and BP 123/54. No acute distress.

## 2015-04-16 NOTE — Care Management Note (Signed)
Case Management Note  Patient Details  Name: Felicia Grant MRN: [id number] Date of Birth: 1946/01/23  Subjective/Objective:                  Pt admitted with pyelonephritis. Pt is from [facility] ALF. Unable to complete assessment at this time as pt is confused per staff. Anticipate return to ALF at DC. ?need for HH/PT. CSW is aware of DC plan.   Action/Plan: Will need PT eval prior to DC. Will cont to follow.   Expected Discharge Date:  04/18/15               Expected Discharge Plan:  Assisted Living / Rest Home  In-House Referral:  Clinical Social Work  Discharge planning Services  CM Consult  Post Acute Care Choice:  NA Choice offered to:  NA  DME Arranged:    DME Agency:     HH Arranged:    HH Agency:     Status of Service:  In process, will continue to follow  Medicare Important Message Given:    Date Medicare IM Given:    Medicare IM give by:    Date Additional Medicare IM Given:    Additional Medicare Important Message give by:     If discussed at [place] of Stay Meetings, dates discussed:    Additional Comments:  Sherald Barge, RN 04/16/2015, 3:24 PM

## 2015-04-16 NOTE — Progress Notes (Signed)
Pt c/o abdominal pain during bath.  Lower abd. Firm to palpation.  Bladder scan performed.  Residual volume of >999 mL.  Night shift RN unable to place foley catheter d/t obstruction.  MD informed.  Orders given to decrease fluids to 80mL/hr and place urology consult.

## 2015-04-16 NOTE — Consult Note (Addendum)
Felicia Grant MRN: [id number] DOB/AGE: 08-21-1945 70 y.o. Primary Care Physician:[name], MD Admit date: 04/15/2015 Chief Complaint:  Chief Complaint  Patient presents with  . Code Sepsis   HPI: Pt is  70 year old female with past medical hx of DM who was transferred from Nursing home with c/o Nausea.   HPI dates back to yesterday when staff at Nursing home sent pt to ER with c/o Nausea and emesis. Upon evalution In ER pt was found to be hypotensive, febrile and AKI.  Pt was admitted with Sepsis.pt was later found to have urine retention of more than 1 liter. Pt seen today in ICU. Pt remains confused. Pt only complaint is " my belly is hurting"     Past Medical History  Diagnosis Date  . Dementia   . Bradycardia   . Hypertension   . Diabetes mellitus without complication ([place])   . Parkinson's disease ([place])   . Schizo affective schizophrenia ([place])   . COPD (chronic obstructive pulmonary disease) ([place])   . Cardiac pacemaker in situ        History reviewed. No pertinent family history.  Social History:  reports that she has never smoked. She does not have any smokeless tobacco history on file. She reports that she does not drink alcohol. Her drug history is not on file.   Allergies: No Known Allergies  Medications Prior to Admission  Medication Sig Dispense Refill  . acetaminophen (TYLENOL) 325 MG tablet Take 650 mg by mouth every 6 (six) hours as needed for mild pain or moderate pain.    Marland Kitchen amLODipine (NORVASC) 5 MG tablet Take 5 mg by mouth daily.    Marland Kitchen aspirin EC 81 MG tablet Take 81 mg by mouth daily.    . cetirizine (ZYRTEC) 10 MG tablet Take 10 mg by mouth daily.    . cholecalciferol (VITAMIN D) 400 units TABS tablet Take 400 Units by mouth daily.    Marland Kitchen docusate sodium (COLACE) 100 MG capsule Take 100 mg by mouth 2 (two) times daily as needed for mild constipation.    . ferrous sulfate 325 (65 FE) MG tablet Take 325 mg by mouth at bedtime.    Marland Kitchen lisinopril  (PRINIVIL,ZESTRIL) 40 MG tablet Take 40 mg by mouth daily.    . metFORMIN (GLUCOPHAGE) 500 MG tablet Take 500-1,000 mg by mouth 2 (two) times daily with a meal. 2 tablets every morning and 1 tablet every evening    . nystatin cream (MYCOSTATIN) Apply 1 application topically 2 (two) times daily.    . risperiDONE (RISPERDAL) 1 MG tablet Take 1 mg by mouth at bedtime.    . senna-docusate (TGT SENNA LAX/STOOL SOFTENER) 8.6-50 MG tablet Take 1 tablet by mouth daily.    . simvastatin (ZOCOR) 40 MG tablet Take 40 mg by mouth at bedtime.    . tolterodine (DETROL LA) 4 MG 24 hr capsule Take 4 mg by mouth daily.         [id number] from the symptoms mentioned above,there are no other symptoms referable to all systems reviewed.  Marland Kitchen antiseptic oral rinse  7 mL Mouth Rinse BID  . insulin aspart  0-9 Units Subcutaneous TID WC  . ondansetron (ZOFRAN) IV  4 mg Intravenous 4 times per day  . pantoprazole (PROTONIX) IV  40 mg Intravenous Daily  . piperacillin-tazobactam (ZOSYN)  IV  2.25 g Intravenous Q8H  . risperiDONE  1 mg Oral QHS  . [START ON 04/17/2015] vancomycin  1,000 mg Intravenous Q48H  Physical Exam: Vital signs in last 24 hours: Temp:  [97.8 F (36.6 C)-98.8 F (37.1 C)] 98.8 F (37.1 C) (03/02 0823) Pulse Rate:  [78-96] 96 (03/02 0600) Resp:  [15-30] 18 (03/02 0600) BP: (86-141)/(36-74) 114/55 mmHg (03/02 0600) SpO2:  [95 %-100 %] 99 % (03/02 0600) Weight:  [193 lb 5.5 oz (87.7 kg)-195 lb 12.3 oz (88.8 kg)] 195 lb 12.3 oz (88.8 kg) (03/02 0500) Weight change:  Last BM Date: 04/15/15  Intake/Output from previous day: 03/01 0701 - 03/02 0700 In: 1200 [I.V.:1200] Out: -      Physical Exam: General- pt is awake,following commands Resp- No acute REsp distress, decreased bs at bases. CVS- S1S2 regular in rate and rhythm GIT- BS+,formk in lower supra ubic area. EXT- NO LE Edema, NO Cyanosis CNS- CN 2-12 grossly intact. Moving all 4 extremities   Lab Results: CBC  Recent  Labs  04/15/15 1106 04/16/15 0518  WBC 4.6 4.3  HGB 8.3* 7.9*  HCT 27.3* 27.4*  PLT 198 192    BMET  Recent Labs  04/15/15 1106 04/16/15 0518  NA 134* 139  K 5.1 4.9  CL 103 110  CO2 19* 15*  GLUCOSE 208* 188*  BUN 93* 87*  CREATININE 4.95* 4.39*  CALCIUM 8.5* 8.2*   Creat trend  2017  4.95=>4.39   MICRO Recent Results (from the past 240 hour(s))  Blood Culture (routine x 2)     Status: None (Preliminary result)   Collection Time: 04/15/15 11:07 AM  Result Value Ref Range Status   Specimen Description BLOOD RIGHT HAND DRAWN BY RN  Final   Special Requests BOTTLES DRAWN AEROBIC ONLY 6CC  Final   Culture NO GROWTH < 24 HOURS  Final   Report Status PENDING  Incomplete  Blood Culture (routine x 2)     Status: None (Preliminary result)   Collection Time: 04/15/15 11:08 AM  Result Value Ref Range Status   Specimen Description BLOOD RIGHT ANTECUBITAL DRAWN BY RN  Final   Special Requests BOTTLES DRAWN AEROBIC ONLY 5CC  Final   Culture NO GROWTH < 24 HOURS  Final   Report Status PENDING  Incomplete  MRSA PCR Screening     Status: None   Collection Time: 04/16/15  3:30 AM  Result Value Ref Range Status   MRSA by PCR NEGATIVE NEGATIVE Final    Comment:        The GeneXpert MRSA Assay (FDA approved for NASAL specimens only), is one component of a comprehensive MRSA colonization surveillance program. It is not intended to diagnose MRSA infection nor to guide or monitor treatment for MRSA infections.       Lab Results  Component Value Date   CALCIUM 8.2* 04/16/2015      Impression: 1)Renal  AKI secondary to Prerenal/ATN and Post renal                Multiple etiology data               ATN- hypotensive + ACE on board + UTI               Post renal- urine retention + Renal u/s + CT shows hydro                           AKi minimally better               2)CVs- hemodynamically better   3)Anemia HGb stable  4)Urology-  pt admitted with B/l hydro           Primary team and urology being consulted.  5)ID- admitted with possible sepsis from pyelonephritis       PMD following  6)Electrolytes  Hyperkalemic      Now better  NOrmonatremic   7)Acid base Co2 not  at goal Will follow     Plan:  Agree with current tx and plan Agree with holding ACe Agree with urology help Agree with IVf Will suggest to please check Vanco levels Will suggest to ask for urology help on sooner basis.        Esha Fincher S 04/16/2015, 12:07 PM

## 2015-04-17 ENCOUNTER — Encounter (HOSPITAL_COMMUNITY): Payer: Self-pay | Admitting: Internal Medicine

## 2015-04-17 ENCOUNTER — Inpatient Hospital Stay (HOSPITAL_COMMUNITY): Payer: Medicare Other

## 2015-04-17 ENCOUNTER — Telehealth: Payer: Self-pay | Admitting: Obstetrics and Gynecology

## 2015-04-17 DIAGNOSIS — N12 Tubulo-interstitial nephritis, not specified as acute or chronic: Secondary | ICD-10-CM

## 2015-04-17 DIAGNOSIS — N950 Postmenopausal bleeding: Secondary | ICD-10-CM

## 2015-04-17 DIAGNOSIS — D4959 Neoplasm of unspecified behavior of other genitourinary organ: Secondary | ICD-10-CM

## 2015-04-17 DIAGNOSIS — N1330 Unspecified hydronephrosis: Secondary | ICD-10-CM

## 2015-04-17 DIAGNOSIS — N179 Acute kidney failure, unspecified: Secondary | ICD-10-CM

## 2015-04-17 DIAGNOSIS — D398 Neoplasm of uncertain behavior of other specified female genital organs: Secondary | ICD-10-CM

## 2015-04-17 DIAGNOSIS — R338 Other retention of urine: Secondary | ICD-10-CM

## 2015-04-17 DIAGNOSIS — R1030 Lower abdominal pain, unspecified: Secondary | ICD-10-CM

## 2015-04-17 HISTORY — DX: Neoplasm of unspecified behavior of other genitourinary organ: D49.59

## 2015-04-17 LAB — GLUCOSE, CAPILLARY
GLUCOSE-CAPILLARY: 225 mg/dL — AB (ref 65–99)
GLUCOSE-CAPILLARY: 202 mg/dL — AB (ref 65–99)
GLUCOSE-CAPILLARY: 220 mg/dL — AB (ref 65–99)
Glucose-Capillary: 238 mg/dL — ABNORMAL HIGH (ref 65–99)

## 2015-04-17 LAB — CBC
HCT: 31.8 % — ABNORMAL LOW (ref 36.0–46.0)
HEMATOCRIT: 24.6 % — AB (ref 36.0–46.0)
HEMOGLOBIN: 7.4 g/dL — AB (ref 12.0–15.0)
Hemoglobin: 9.9 g/dL — ABNORMAL LOW (ref 12.0–15.0)
MCH: 21.8 pg — ABNORMAL LOW (ref 26.0–34.0)
MCH: 23.3 pg — ABNORMAL LOW (ref 26.0–34.0)
MCHC: 30.1 g/dL (ref 30.0–36.0)
MCHC: 31.1 g/dL (ref 30.0–36.0)
MCV: 72.4 fL — ABNORMAL LOW (ref 78.0–100.0)
MCV: 74.8 fL — ABNORMAL LOW (ref 78.0–100.0)
PLATELETS: 167 10*3/uL (ref 150–400)
Platelets: 173 10*3/uL (ref 150–400)
RBC: 3.4 MIL/uL — AB (ref 3.87–5.11)
RBC: 4.25 MIL/uL (ref 3.87–5.11)
RDW: 19.3 % — ABNORMAL HIGH (ref 11.5–15.5)
RDW: 18.9 % — AB (ref 11.5–15.5)
WBC: 3.9 10*3/uL — AB (ref 4.0–10.5)
WBC: 4.2 10*3/uL (ref 4.0–10.5)

## 2015-04-17 LAB — BASIC METABOLIC PANEL
ANION GAP: 11 (ref 5–15)
BUN: 76 mg/dL — ABNORMAL HIGH (ref 6–20)
CALCIUM: 8 mg/dL — AB (ref 8.9–10.3)
CHLORIDE: 109 mmol/L (ref 101–111)
CO2: 21 mmol/L — AB (ref 22–32)
Creatinine, Ser: 3.45 mg/dL — ABNORMAL HIGH (ref 0.44–1.00)
GFR calc non Af Amer: 12 mL/min — ABNORMAL LOW (ref >60)
GFR, EST AFRICAN AMERICAN: 14 mL/min — AB (ref >60)
Glucose, Bld: 261 mg/dL — ABNORMAL HIGH (ref 65–99)
Potassium: 4.1 mmol/L (ref 3.5–5.1)
SODIUM: 141 mmol/L (ref 135–145)

## 2015-04-17 LAB — ABO/RH: ABO/RH(D): O POS

## 2015-04-17 LAB — PREPARE RBC (CROSSMATCH)

## 2015-04-17 MED ORDER — FUROSEMIDE 10 MG/ML IJ SOLN
10.0000 mg | Freq: Once | INTRAMUSCULAR | Status: AC
  Administered 2015-04-17: 10 mg via INTRAVENOUS
  Filled 2015-04-17: qty 2

## 2015-04-17 MED ORDER — CYANOCOBALAMIN 1000 MCG/ML IJ SOLN
1000.0000 ug | Freq: Every day | INTRAMUSCULAR | Status: AC
  Administered 2015-04-17 – 2015-04-19 (×3): 1000 ug via INTRAMUSCULAR
  Filled 2015-04-17 (×3): qty 1

## 2015-04-17 MED ORDER — SODIUM CHLORIDE 0.9 % IV SOLN: 250.0000 mL | INTRAVENOUS | Status: DC | PRN

## 2015-04-17 MED ORDER — SODIUM CHLORIDE 0.9% FLUSH
3.0000 mL | INTRAVENOUS | Status: DC | PRN
Start: 2015-04-17 — End: 2015-04-21

## 2015-04-17 MED ORDER — SODIUM CHLORIDE 0.9% FLUSH
3.0000 mL | Freq: Two times a day (BID) | INTRAVENOUS | Status: DC
  Administered 2015-04-17 – 2015-04-21 (×8): 3 mL via INTRAVENOUS

## 2015-04-17 MED ORDER — SODIUM CHLORIDE 0.9 % IV SOLN
Freq: Once | INTRAVENOUS | Status: AC
  Administered 2015-04-17: 14:00:00 via INTRAVENOUS

## 2015-04-17 MED ORDER — INSULIN ASPART 100 UNIT/ML ~~LOC~~ SOLN
0.0000 [IU] | Freq: Three times a day (TID) | SUBCUTANEOUS | Status: DC
  Administered 2015-04-17 (×2): 5 [IU] via SUBCUTANEOUS
  Administered 2015-04-18 – 2015-04-19 (×6): 3 [IU] via SUBCUTANEOUS
  Administered 2015-04-20 – 2015-04-21 (×3): 2 [IU] via SUBCUTANEOUS
  Administered 2015-04-21: 3 [IU] via SUBCUTANEOUS

## 2015-04-17 MED ORDER — INSULIN ASPART 100 UNIT/ML ~~LOC~~ SOLN
0.0000 [IU] | Freq: Every day | SUBCUTANEOUS | Status: DC
  Administered 2015-04-17: 2 [IU] via SUBCUTANEOUS

## 2015-04-17 NOTE — Progress Notes (Signed)
Felicia Grant  MRN: [id number]  DOB/AGE: Dec 13, 1945 70 y.o.  Primary Care Physician:[name], MD  Admit date: 04/15/2015  Chief Complaint:  Chief Complaint  Patient presents with  . Code Sepsis    S-Pt presented on  04/15/2015 with  Chief Complaint  Patient presents with  . Code Sepsis  .    Pt today feels better.Pt says " my belly is better"  Meds . sodium chloride   Intravenous Once  . antiseptic oral rinse  7 mL Mouth Rinse BID  . cyanocobalamin  1,000 mcg Intramuscular Daily  . furosemide  10 mg Intravenous Once  . insulin aspart  0-15 Units Subcutaneous TID WC  . insulin aspart  0-5 Units Subcutaneous QHS  . ondansetron (ZOFRAN) IV  4 mg Intravenous 4 times per day  . pantoprazole (PROTONIX) IV  40 mg Intravenous Daily  . piperacillin-tazobactam (ZOSYN)  IV  2.25 g Intravenous Q8H  . risperiDONE  1 mg Oral QHS  . vancomycin  1,000 mg Intravenous Q48H      Physical Exam: Vital signs in last 24 hours: Temp:  [97 F (36.1 C)-99.1 F (37.3 C)] 97 F (36.1 C) (03/03 0818) Pulse Rate:  [59-93] 73 (03/03 0630) Resp:  [8-22] 14 (03/03 0630) BP: (77-138)/(32-101) 128/44 mmHg (03/03 0630) SpO2:  [88 %-100 %] 100 % (03/03 0630) Weight:  [191 lb 12.8 oz (87 kg)] 191 lb 12.8 oz (87 kg) (03/03 0500) Weight change: -8 lb 3.2 oz (-3.719 kg) Last BM Date: 04/15/15  Intake/Output from previous day: 03/02 0701 - 03/03 0700 In: 3010 [P.O.:360; I.V.:900; IV Piggyback:150] Out: 1750 [Urine:1750] Total I/O In: 50 [IV Piggyback:50] Out: 600 [Urine:600]   Physical Exam: General- pt is awake,follws commands intermittently Resp- No acute REsp distress, Rhonchi + CVS- S1S2 regular in rate and rhythm GIT- BS+, softer than yesterday , NT, EXT- NO LE Edema, NOCyanosis   Lab Results: CBC  Recent Labs  04/16/15 0518 04/17/15 0517  WBC 4.3 3.9*  HGB 7.9* 7.4*  HCT 27.4* 24.6*  PLT 192 173    BMET  Recent Labs  04/16/15 0518 04/17/15 0517  NA 139 141  K 4.9  4.1  CL 110 109  CO2 15* 21*  GLUCOSE 188* 261*  BUN 87* 76*  CREATININE 4.39* 3.45*  CALCIUM 8.2* 8.0*   Creat trend 2017 4.95=>4.39=>3.45   MICRO Recent Results (from the past 240 hour(s))  Blood Culture (routine x 2)     Status: None (Preliminary result)   Collection Time: 04/15/15 11:07 AM  Result Value Ref Range Status   Specimen Description BLOOD RIGHT HAND DRAWN BY RN  Final   Special Requests BOTTLES DRAWN AEROBIC ONLY 6CC  Final   Culture NO GROWTH 2 DAYS  Final   Report Status PENDING  Incomplete  Blood Culture (routine x 2)     Status: None (Preliminary result)   Collection Time: 04/15/15 11:08 AM  Result Value Ref Range Status   Specimen Description BLOOD RIGHT ANTECUBITAL DRAWN BY RN  Final   Special Requests BOTTLES DRAWN AEROBIC ONLY 5CC  Final   Culture NO GROWTH 2 DAYS  Final   Report Status PENDING  Incomplete  Urine culture     Status: None (Preliminary result)   Collection Time: 04/15/15  1:08 PM  Result Value Ref Range Status   Specimen Description URINE, CLEAN CATCH  Final   Special Requests NONE  Final   Culture   Final    >=100,000 COLONIES/mL PROTEUS MIRABILIS Performed at  [facility]    Report Status PENDING  Incomplete  MRSA PCR Screening     Status: None   Collection Time: 04/16/15  3:30 AM  Result Value Ref Range Status   MRSA by PCR NEGATIVE NEGATIVE Final    Comment:        The GeneXpert MRSA Assay (FDA approved for NASAL specimens only), is one component of a comprehensive MRSA colonization surveillance program. It is not intended to diagnose MRSA infection nor to guide or monitor treatment for MRSA infections.       Lab Results  Component Value Date   CALCIUM 8.0* 04/17/2015               Impression: 1)Renal AKI secondary to Prerenal/ATN and Post renal   Multiple etiology data  ATN- hypotensive + ACE on board + UTI  Post renal- urine retention + Renal u/s +  CT shows hydro    AKi better    Creat trending down  2)CVs- hemodynamically better   3)Anemia HGb stable  4)Urology- pt admitted with B/l hydro  Primary team and urology being consulted.  5)ID- admitted with possible sepsis from pyelonephritis  PMD following  6)Electrolytes  Hyperkalemic  Now better  NOrmonatremic   7)Acid base Co2 now  at goal    Plan:  AKi improving Will continue current tx      Willett Lefeber S 04/17/2015, 11:52 AM

## 2015-04-17 NOTE — Telephone Encounter (Signed)
Pt seen as inpt, please see note as inpt. Chart.

## 2015-04-17 NOTE — Progress Notes (Signed)
Inpatient Diabetes Program Recommendations  AACE/ADA: New Consensus Statement on Inpatient Glycemic Control (2015)  Target Ranges:  Prepandial:   less than 140 mg/dL      Peak postprandial:   less than 180 mg/dL (1-2 hours)      Critically ill patients:  140 - 180 mg/dL  Results for Felicia Grant, Felicia Grant (MRN [id number]) as of 04/17/2015 09:45  Ref. Range 04/16/2015 07:42 04/16/2015 11:22 04/16/2015 16:20 04/16/2015 21:28 04/17/2015 07:37  Glucose-Capillary Latest Ref Range: 65-99 mg/dL 156 (H) 153 (H) 209 (H) 255 (H) 225 (H)   Review of Glycemic Control  Diabetes history: DM2 Outpatient Diabetes medications: Metformin 1000 mg QAM, Metformin 500 mg QPM Current orders for Inpatient glycemic control: Novolog 0-9 units TID with meals  Inpatient Diabetes Program Recommendations: Correction (SSI): Please consider increasing Novolog correction to moderate scale and adding Novolog bedtime correction scale.  Thanks, Barnie Alderman, RN, MSN, CDE Diabetes Coordinator Inpatient Diabetes Program [phone] (Team Pager from [place] to [place]) [phone] ([place] office) [phone] [facility] office) [phone] [facility] office)

## 2015-04-17 NOTE — Consult Note (Signed)
Reason for Consult:Vaginal mass, with urinary outlet obstruction. Referring Physician: Dr Alm Bustard, Dr Alyson Ingles  Felicia Grant is an 70 y.o. female.in [facility] ICU bed 07, admitted for suspected urrinary sepsis, found to have urinary retention, due to outlet obsruction due to a vaginal mass, that extends the entire length of the vagina to the introitus along the right vaginal sidewall. A vaginal or cervical cancer with extension is suspected.  Pertinent Gynecological History: Menses: post-menopausal Bleeding: post menopausal bleeding at present, minimal, with no reported history prior to this admission Contraception:  DES exposure:  Blood transfusions: being considered at present , pt is jehovah's witness. Sexually transmitted diseases:  Previous GYN Procedures:   Last mammogram: none on record Date: ? Last pap:  Date: unknown \  Menstrual History: Menarche age:  No LMP recorded. Patient is postmenopausal.    Past Medical History  Diagnosis Date  . Dementia   . Bradycardia   . Hypertension   . Diabetes mellitus without complication ([place])   . Parkinson's disease ([place])   . Schizo affective schizophrenia ([place])   . COPD (chronic obstructive pulmonary disease) ([place])   . Cardiac pacemaker in situ   . 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 Hip surgery      History reviewed. No pertinent family history.  Social History:  reports that she has never smoked. She does not have any smokeless tobacco history on file. She reports that she does not drink alcohol. Her drug history is not on file.  Allergies: No Known Allergies  Medications: I have reviewed the patient's current medications.  ROS  Blood pressure 128/44, pulse 73, temperature 97.1 F (36.2 C), temperature source Oral, resp. rate 14, height 5' 4"  (1.626 m), weight 191 lb 12.8 oz (87 kg), SpO2 100 %. Physical Exam  Genitourinary:  EFG normal , no inguinal adenopathy Vag: entire  right vaginal sidewall involved in a firm irregular fixed mass that extends down the vagina to the introitus. Unable to palpate an upper margin to the tumor, making a cervical origin to tumor possible as well as vaginal origin.  Skin: Skin is warm and dry.  Psychiatric: She has a normal mood and affect.  Dementia present. Pt obeys and cooperates with care.  lite bleeding present at exam  Results for orders placed or performed during the hospital encounter of 04/15/15 (from the past 48 hour(s))  Urinalysis, Routine w reflex microscopic (not at [facility])     Status: Abnormal   Collection Time: 04/15/15  1:08 PM  Result Value Ref Range   Color, Urine BROWN (A) YELLOW    Comment: BIOCHEMICALS MAY BE AFFECTED BY COLOR   APPearance HAZY (A) CLEAR   Specific Gravity, Urine 1.015 1.005 - 1.030   pH 8.5 (H) 5.0 - 8.0   Glucose, UA 100 (A) NEGATIVE mg/dL   Hgb urine dipstick LARGE (A) NEGATIVE   Bilirubin Urine SMALL (A) NEGATIVE   Ketones, ur NEGATIVE NEGATIVE mg/dL   Protein, ur >300 (A) NEGATIVE mg/dL   Nitrite POSITIVE (A) NEGATIVE   Leukocytes, UA MODERATE (A) NEGATIVE  Urine culture     Status: None (Preliminary result)   Collection Time: 04/15/15  1:08 PM  Result Value Ref Range   Specimen Description URINE, CLEAN CATCH    Special Requests NONE    Culture      >=100,000 COLONIES/mL PROTEUS MIRABILIS Performed at [facility]    Report Status PENDING   Urine microscopic-add on  Status: Abnormal   Collection Time: 04/15/15  1:08 PM  Result Value Ref Range   Squamous Epithelial / LPF 0-5 (A) NONE SEEN   WBC, UA 6-30 0 - 5 WBC/hpf   RBC / HPF TOO NUMEROUS TO COUNT 0 - 5 RBC/hpf   Bacteria, UA MANY (A) NONE SEEN  I-Stat CG4 Lactic Acid, ED  (not at  [facility])     Status: None   Collection Time: 04/15/15  1:59 PM  Result Value Ref Range   Lactic Acid, Venous 1.70 0.5 - 2.0 mmol/L  Vitamin B12     Status: None   Collection Time: 04/15/15  7:15 PM  Result Value Ref Range    Vitamin B-12 187 180 - 914 pg/mL    Comment: (NOTE) This assay is not validated for testing neonatal or myeloproliferative syndrome specimens for Vitamin B12 levels. Performed at [facility]   Folate     Status: None   Collection Time: 04/15/15  7:15 PM  Result Value Ref Range   Folate 10.8 >5.9 ng/mL    Comment: Performed at [facility]  Iron and TIBC     Status: Abnormal   Collection Time: 04/15/15  7:15 PM  Result Value Ref Range   Iron 5 (L) 28 - 170 ug/dL   TIBC 269 250 - 450 ug/dL   Saturation Ratios 2 (L) 10.4 - 31.8 %   UIBC 264 ug/dL    Comment: Performed at [facility]  Ferritin     Status: None   Collection Time: 04/15/15  7:15 PM  Result Value Ref Range   Ferritin 28 11 - 307 ng/mL    Comment: Performed at [facility]  Reticulocytes     Status: Abnormal   Collection Time: 04/15/15  7:15 PM  Result Value Ref Range   Retic Ct Pct 0.5 0.4 - 3.1 %   RBC. 3.61 (L) 3.87 - 5.11 MIL/uL   Retic Count, Manual 18.1 (L) 19.0 - 186.0 K/uL  Type and screen [facility]     Status: None (Preliminary result)   Collection Time: 04/15/15  7:15 PM  Result Value Ref Range   ABO/RH(D) O POS    Antibody Screen NEG    Sample Expiration 04/18/2015    Unit Number [id number]    Blood Component Type RBC LR PHER1    Unit division 00    Status of Unit ALLOCATED    Transfusion Status OK TO TRANSFUSE    Crossmatch Result Compatible    Unit Number [id number]    Blood Component Type RED CELLS,LR    Unit division 00    Status of Unit ALLOCATED    Transfusion Status OK TO TRANSFUSE    Crossmatch Result Compatible   ABO/Rh     Status: None   Collection Time: 04/15/15  7:15 PM  Result Value Ref Range   ABO/RH(D) O POS   Prepare RBC     Status: None   Collection Time: 04/15/15  7:45 PM  Result Value Ref Range   Order Confirmation ORDER PROCESSED BY BLOOD BANK   Glucose, capillary     Status: Abnormal   Collection Time: 04/16/15 12:22 AM   Result Value Ref Range   Glucose-Capillary 153 (H) 65 - 99 mg/dL  MRSA PCR Screening     Status: None   Collection Time: 04/16/15  3:30 AM  Result Value Ref Range   MRSA by PCR NEGATIVE NEGATIVE    Comment:  The GeneXpert MRSA Assay (FDA approved for NASAL specimens only), is one component of a comprehensive MRSA colonization surveillance program. It is not intended to diagnose MRSA infection nor to guide or monitor treatment for MRSA infections.   Comprehensive metabolic panel     Status: Abnormal   Collection Time: 04/16/15  5:18 AM  Result Value Ref Range   Sodium 139 135 - 145 mmol/L   Potassium 4.9 3.5 - 5.1 mmol/L   Chloride 110 101 - 111 mmol/L   CO2 15 (L) 22 - 32 mmol/L   Glucose, Bld 188 (H) 65 - 99 mg/dL   BUN 87 (H) 6 - 20 mg/dL   Creatinine, Ser 4.39 (H) 0.44 - 1.00 mg/dL   Calcium 8.2 (L) 8.9 - 10.3 mg/dL   Total Protein 5.7 (L) 6.5 - 8.1 g/dL   Albumin 2.7 (L) 3.5 - 5.0 g/dL   AST 22 15 - 41 U/L   ALT 12 (L) 14 - 54 U/L   Alkaline Phosphatase 61 38 - 126 U/L   Total Bilirubin 0.6 0.3 - 1.2 mg/dL   GFR calc non Af Amer 9 (L) >60 mL/min   GFR calc Af Amer 11 (L) >60 mL/min    Comment: (NOTE) The eGFR has been calculated using the CKD EPI equation. This calculation has not been validated in all clinical situations. eGFR's persistently <60 mL/min signify possible Chronic Kidney Disease.    Anion gap 14 5 - 15  CBC     Status: Abnormal   Collection Time: 04/16/15  5:18 AM  Result Value Ref Range   WBC 4.3 4.0 - 10.5 K/uL   RBC 3.73 (L) 3.87 - 5.11 MIL/uL   Hemoglobin 7.9 (L) 12.0 - 15.0 g/dL   HCT 27.4 (L) 36.0 - 46.0 %   MCV 73.5 (L) 78.0 - 100.0 fL   MCH 21.2 (L) 26.0 - 34.0 pg   MCHC 28.8 (L) 30.0 - 36.0 g/dL   RDW 19.4 (H) 11.5 - 15.5 %   Platelets 192 150 - 400 K/uL    Comment: SPECIMEN CHECKED FOR CLOTS CONSISTENT WITH PREVIOUS RESULT   Glucose, capillary     Status: Abnormal   Collection Time: 04/16/15  7:42 AM  Result Value Ref  Range   Glucose-Capillary 156 (H) 65 - 99 mg/dL   Comment 1 Notify RN    Comment 2 Document in Chart   Glucose, capillary     Status: Abnormal   Collection Time: 04/16/15 11:22 AM  Result Value Ref Range   Glucose-Capillary 153 (H) 65 - 99 mg/dL   Comment 1 Notify RN    Comment 2 Document in Chart   Glucose, capillary     Status: Abnormal   Collection Time: 04/16/15  4:20 PM  Result Value Ref Range   Glucose-Capillary 209 (H) 65 - 99 mg/dL   Comment 1 Notify RN    Comment 2 Document in Chart   Glucose, capillary     Status: Abnormal   Collection Time: 04/16/15  9:28 PM  Result Value Ref Range   Glucose-Capillary 255 (H) 65 - 99 mg/dL  CBC     Status: Abnormal   Collection Time: 04/17/15  5:17 AM  Result Value Ref Range   WBC 3.9 (L) 4.0 - 10.5 K/uL   RBC 3.40 (L) 3.87 - 5.11 MIL/uL   Hemoglobin 7.4 (L) 12.0 - 15.0 g/dL   HCT 24.6 (L) 36.0 - 46.0 %   MCV 72.4 (L) 78.0 - 100.0 fL  MCH 21.8 (L) 26.0 - 34.0 pg   MCHC 30.1 30.0 - 36.0 g/dL   RDW 19.3 (H) 11.5 - 15.5 %   Platelets 173 150 - 400 K/uL  Basic metabolic panel     Status: Abnormal   Collection Time: 04/17/15  5:17 AM  Result Value Ref Range   Sodium 141 135 - 145 mmol/L   Potassium 4.1 3.5 - 5.1 mmol/L   Chloride 109 101 - 111 mmol/L   CO2 21 (L) 22 - 32 mmol/L   Glucose, Bld 261 (H) 65 - 99 mg/dL   BUN 76 (H) 6 - 20 mg/dL   Creatinine, Ser 3.45 (H) 0.44 - 1.00 mg/dL   Calcium 8.0 (L) 8.9 - 10.3 mg/dL   GFR calc non Af Amer 12 (L) >60 mL/min   GFR calc Af Amer 14 (L) >60 mL/min    Comment: (NOTE) The eGFR has been calculated using the CKD EPI equation. This calculation has not been validated in all clinical situations. eGFR's persistently <60 mL/min signify possible Chronic Kidney Disease.    Anion gap 11 5 - 15  Glucose, capillary     Status: Abnormal   Collection Time: 04/17/15  7:37 AM  Result Value Ref Range   Glucose-Capillary 225 (H) 65 - 99 mg/dL   Comment 1 Notify RN   Glucose, capillary      Status: Abnormal   Collection Time: 04/17/15 12:11 PM  Result Value Ref Range   Glucose-Capillary 238 (H) 65 - 99 mg/dL   Comment 1 Notify RN     Ct Abdomen Pelvis Wo Contrast  04/15/2015  CLINICAL DATA:  70 year old female inpatient with lower abdominal pain, nausea and vomiting. Dysuria. EXAM: CT ABDOMEN AND PELVIS WITHOUT CONTRAST TECHNIQUE: Multidetector CT imaging of the abdomen and pelvis was performed following the standard protocol without IV contrast. COMPARISON:  None. FINDINGS: Lower chest: No significant pulmonary nodules or acute consolidative airspace disease. There is a bulla/pneumatocele at the right lower lobe base. Pacer leads are seen terminating in the right atrium and right ventricular apex. Trace pericardial fluid/thickening. Coronary atherosclerosis. Nonspecific calcifications are present throughout both breasts. Hepatobiliary: Normal liver with no liver mass. Normal gallbladder with no radiopaque cholelithiasis. No biliary ductal dilatation. Pancreas: There is complete fatty replacement of the pancreas, with no pancreatic mass or appreciable pancreatic ductal dilatation. Spleen: Normal size. No mass. Adrenals/Urinary Tract: Mild diffuse thickening of both adrenal glands, without discrete adrenal nodule, suggesting mild adrenal hyperplasia. There is symmetric mild bilateral hydroureteronephrosis. No renal or ureteral stones. No contour deforming renal masses. A prominently distended urinary bladder with diffuse bladder wall thickening and prominent fat stranding surrounding the entire bladder wall. Stomach/Bowel: Grossly normal stomach. Normal caliber small bowel with no small bowel wall thickening. Appendix is not discretely visualized. Normal large bowel with no diverticulosis, large bowel wall thickening or pericolonic fat stranding. Vascular/Lymphatic: Atherosclerotic nonaneurysmal abdominal aorta. No pathologically enlarged lymph nodes in the abdomen or pelvis. Reproductive: No  adnexal mass. The region of the uterus is poorly visualized on this noncontrast study. Uterus could be atrophic or surgically absent. Other: No pneumoperitoneum, ascites or focal fluid collection. Musculoskeletal: No aggressive appearing focal osseous lesions. 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 Moderate degenerative changes in the visualized thoracolumbar spine. IMPRESSION: 1. Prominently distended urinary bladder. Mild bilateral hydroureteronephrosis. Diffuse bladder wall thickening and prominent perivesical fat stranding. Findings suggest acute infectious or inflammatory cystitis and bladder outlet obstruction. Urgent Foley catheter placement advised. 2. Additional findings include coronary  atherosclerosis, mild adrenal hyperplasia and severe right hip osteoarthritis. These results were called by telephone at the time of interpretation on 04/15/2015 at 9:45 pm to Dr. Dayna Barker, who verbally acknowledged these results. Electronically Signed   By: Ilona Sorrel M.D.   On: 04/15/2015 21:49   Dg Chest 2 View  04/15/2015  CLINICAL DATA:  Pt sent from assisted living for of vomiting. hypotensive and is currently being tx for UTI. EXAM: CHEST  2 VIEW COMPARISON:  None. FINDINGS: LEFT-sided pacemaker overlies normal cardiac silhouette. Low lung volumes. Central venous congestion. No focal infiltrate or pneumothorax. IMPRESSION: Low lung volumes and central venous congestion.  No acute findings. Electronically Signed   By: Suzy Bouchard M.D.   On: 04/15/2015 14:40   US Renal  04/16/2015  CLINICAL DATA:  Acute kidney injury. EXAM: RENAL / URINARY TRACT ULTRASOUND COMPLETE COMPARISON:  04/15/2015 FINDINGS: Right Kidney: Length: 12.6 cm. Mild hydronephrosis. Normal echogenicity of the renal parenchyma. No stones identified. No discrete renal mass seen. Left Kidney: Length: 12.1 cm. The kidney is very difficult to visualize due to location of bowel and body habitus as well as difficulty positioning the  patient. Suspected mild left hydronephrosis. Bladder: There is abnormal echogenic filling defect in the urinary bladder. We did not demonstrate any Doppler flow within this multilobular filling defect which is irregular and accordingly difficult to measure. We did not see ureteral jets. IMPRESSION: 1. Abnormal filling defect in the urinary bladder without definite internal flow. This is irregular and probably blood products. 2. We did not see ureteral jets of urine entering the urinary bladder to reassure for complete patency of the ureters. Also, there is bilateral hydronephrosis. 3. Very poor definition of the left kidney due to body habitus, bowel positioning, and difficulty positioning the patient. 4. Renal echogenicity normal on the right and probably normal on the left. Electronically Signed   By: Van Clines M.D.   On: 04/16/2015 11:29    Assessment/Plan: Vaginal/ possible cervical mass, highly suspicious for malignancy Urinary outlet obstruction due to mass, relieved by catheterization at present , with responding improvement of renal function.  Plan:  I have contacted  The office of Dr Denman George, [place] Oncologist, at [facility]. She is in [place] today. I feel that pt will likely need transfer, either to [facility], or [place]. For consideration of radiation therapy, once dx established fully. I will work with Dr Denman George over the weekend to arrange plan. For now, current care addressing her azotemia, and overall medical condition will likely continue thru weekend, with foley catheter in long-term. Thank you,   Jonnie Kind 04/17/2015 [phone] (cell)

## 2015-04-17 NOTE — Progress Notes (Signed)
Patient ID: Felicia Grant, female   DOB: 10/06/1945, 70 y.o.   MRN: [id number]    Subjective: Felicia Grant has a vaginal mass with urethral obstruction and urinary retention with obstructive uropathy.   She had a foley placed yesterday by Dr. Alyson Ingles and it is draining well but she doesn't appear to have had a post obstructive diuresis.  Her Cr is falling.   On review of her CT she appears to have had a hysterectomy but that is not reflected in her surgical history in the chart.   ROS:  Review of Systems  Gastrointestinal: Negative for abdominal pain.  Genitourinary: Negative for hematuria.    Anti-infectives: Anti-infectives    Start     Dose/Rate Route Frequency Ordered Stop   04/17/15 1200  vancomycin (VANCOCIN) IVPB 1000 mg/200 mL premix     1,000 mg 200 mL/hr over 60 Minutes Intravenous Every 48 hours 04/16/15 0842     04/16/15 0830  piperacillin-tazobactam (ZOSYN) 2.25 g in dextrose 5 % 50 mL IVPB     2.25 g 100 mL/hr over 30 Minutes Intravenous Every 8 hours 04/16/15 0806     04/15/15 1145  vancomycin (VANCOCIN) 1,500 mg in sodium chloride 0.9 % 500 mL IVPB     1,500 mg 250 mL/hr over 120 Minutes Intravenous  Once 04/15/15 1111 04/15/15 1343   04/15/15 1100  piperacillin-tazobactam (ZOSYN) IVPB 3.375 g     3.375 g 100 mL/hr over 30 Minutes Intravenous  Once 04/15/15 1051 04/15/15 1231   04/15/15 1100  vancomycin (VANCOCIN) IVPB 1000 mg/200 mL premix  Status:  Discontinued     1,000 mg 200 mL/hr over 60 Minutes Intravenous  Once 04/15/15 1051 04/15/15 1111      Current Facility-Administered Medications  Medication Dose Route Frequency Provider Last Rate Last Dose  . 0.9 %  sodium chloride infusion   Intravenous Once Rexene Alberts, MD      . acetaminophen (TYLENOL) tablet 650 mg  650 mg Oral Q6H PRN Rexene Alberts, MD       Or  . acetaminophen (TYLENOL) suppository 650 mg  650 mg Rectal Q6H PRN Rexene Alberts, MD      . albuterol (PROVENTIL) (2.5 MG/3ML) 0.083% nebulizer  solution 2.5 mg  2.5 mg Nebulization Q2H PRN Rexene Alberts, MD      . alum & mag hydroxide-simeth (MAALOX/MYLANTA) 200-200-20 MG/5ML suspension 30 mL  30 mL Oral Q6H PRN Rexene Alberts, MD      . antiseptic oral rinse (CPC / CETYLPYRIDINIUM CHLORIDE 0.05%) solution 7 mL  7 mL Mouth Rinse BID Rexene Alberts, MD   7 mL at 04/17/15 0904  . cyanocobalamin ((VITAMIN B-12)) injection 1,000 mcg  1,000 mcg Intramuscular Daily Rexene Alberts, MD      . dextrose 5 % 1,000 mL with sodium bicarbonate 100 mEq infusion   Intravenous Continuous Rexene Alberts, MD 75 mL/hr at 04/17/15 0600    . furosemide (LASIX) injection 10 mg  10 mg Intravenous Once Rexene Alberts, MD      . insulin aspart (novoLOG) injection 0-15 Units  0-15 Units Subcutaneous TID WC Rexene Alberts, MD      . insulin aspart (novoLOG) injection 0-5 Units  0-5 Units Subcutaneous QHS Rexene Alberts, MD      . morphine 4 MG/ML injection 3 mg  3 mg Intravenous Q2H PRN Rexene Alberts, MD   3 mg at 04/16/15 0750  . ondansetron (ZOFRAN) injection 4 mg  4 mg Intravenous 4 times per day Rexene Alberts, MD  4 mg at 04/16/15 1701  . pantoprazole (PROTONIX) injection 40 mg  40 mg Intravenous Daily Rexene Alberts, MD   40 mg at 04/17/15 0904  . piperacillin-tazobactam (ZOSYN) 2.25 g in dextrose 5 % 50 mL IVPB  2.25 g Intravenous Q8H [name], MD   2.25 g at 04/17/15 0905  . risperiDONE (RISPERDAL) tablet 1 mg  1 mg Oral QHS Rexene Alberts, MD   1 mg at 04/16/15 2117  . vancomycin (VANCOCIN) IVPB 1000 mg/200 mL premix  1,000 mg Intravenous Q48H Rexene Alberts, MD         Objective: Vital signs in last 24 hours: Temp:  [97 F (36.1 C)-99.1 F (37.3 C)] 97 F (36.1 C) (03/03 0818) Pulse Rate:  [59-93] 73 (03/03 0630) Resp:  [8-22] 14 (03/03 0630) BP: (77-138)/(32-101) 128/44 mmHg (03/03 0630) SpO2:  [88 %-100 %] 100 % (03/03 0630) Weight:  [87 kg (191 lb 12.8 oz)] 87 kg (191 lb 12.8 oz) (03/03 0500)  Intake/Output from previous day: 03/02 0701 - 03/03  0700 In: 3010 [P.O.:360; I.V.:900; IV Piggyback:150] Out: 1750 [Urine:1750] Intake/Output this shift: Total I/O In: 50 [IV Piggyback:50] Out: 600 [Urine:600]   Physical Exam  Constitutional: She is well-developed, well-nourished, and in no distress.  Genitourinary:  Foley is draining clear urine.   Vitals reviewed.   Lab Results:   Recent Labs  04/16/15 0518 04/17/15 0517  WBC 4.3 3.9*  HGB 7.9* 7.4*  HCT 27.4* 24.6*  PLT 192 173   BMET  Recent Labs  04/16/15 0518 04/17/15 0517  NA 139 141  K 4.9 4.1  CL 110 109  CO2 15* 21*  GLUCOSE 188* 261*  BUN 87* 76*  CREATININE 4.39* 3.45*  CALCIUM 8.2* 8.0*   PT/INR No results for input(s): LABPROT, INR in the last 72 hours. ABG No results for input(s): PHART, HCO3 in the last 72 hours.  Invalid input(s): PCO2, PO2  Studies/Results: Ct Abdomen Pelvis Wo Contrast  04/15/2015  CLINICAL DATA:  70 year old female inpatient with lower abdominal pain, nausea and vomiting. Dysuria. EXAM: CT ABDOMEN AND PELVIS WITHOUT CONTRAST TECHNIQUE: Multidetector CT imaging of the abdomen and pelvis was performed following the standard protocol without IV contrast. COMPARISON:  None. FINDINGS: Lower chest: No significant pulmonary nodules or acute consolidative airspace disease. There is a bulla/pneumatocele at the right lower lobe base. Pacer leads are seen terminating in the right atrium and right ventricular apex. Trace pericardial fluid/thickening. Coronary atherosclerosis. Nonspecific calcifications are present throughout both breasts. Hepatobiliary: Normal liver with no liver mass. Normal gallbladder with no radiopaque cholelithiasis. No biliary ductal dilatation. Pancreas: There is complete fatty replacement of the pancreas, with no pancreatic mass or appreciable pancreatic ductal dilatation. Spleen: Normal size. No mass. Adrenals/Urinary Tract: Mild diffuse thickening of both adrenal glands, without discrete adrenal nodule, suggesting  mild adrenal hyperplasia. There is symmetric mild bilateral hydroureteronephrosis. No renal or ureteral stones. No contour deforming renal masses. A prominently distended urinary bladder with diffuse bladder wall thickening and prominent fat stranding surrounding the entire bladder wall. Stomach/Bowel: Grossly normal stomach. Normal caliber small bowel with no small bowel wall thickening. Appendix is not discretely visualized. Normal large bowel with no diverticulosis, large bowel wall thickening or pericolonic fat stranding. Vascular/Lymphatic: Atherosclerotic nonaneurysmal abdominal aorta. No pathologically enlarged lymph nodes in the abdomen or pelvis. Reproductive: No adnexal mass. The region of the uterus is poorly visualized on this noncontrast study. Uterus could be atrophic or surgically absent. Other: No pneumoperitoneum, ascites or focal fluid collection.  Musculoskeletal: No aggressive appearing focal osseous lesions. 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 Moderate degenerative changes in the visualized thoracolumbar spine. IMPRESSION: 1. Prominently distended urinary bladder. Mild bilateral hydroureteronephrosis. Diffuse bladder wall thickening and prominent perivesical fat stranding. Findings suggest acute infectious or inflammatory cystitis and bladder outlet obstruction. Urgent Foley catheter placement advised. 2. Additional findings include coronary atherosclerosis, mild adrenal hyperplasia and severe right hip osteoarthritis. These results were called by telephone at the time of interpretation on 04/15/2015 at 9:45 pm to Dr. Dayna Barker, who verbally acknowledged these results. Electronically Signed   By: Ilona Sorrel M.D.   On: 04/15/2015 21:49   Dg Chest 2 View  04/15/2015  CLINICAL DATA:  Pt sent from assisted living for of vomiting. hypotensive and is currently being tx for UTI. EXAM: CHEST  2 VIEW COMPARISON:  None. FINDINGS: LEFT-sided pacemaker overlies normal cardiac silhouette.  Low lung volumes. Central venous congestion. No focal infiltrate or pneumothorax. IMPRESSION: Low lung volumes and central venous congestion.  No acute findings. Electronically Signed   By: Suzy Bouchard M.D.   On: 04/15/2015 14:40   US Renal  04/16/2015  CLINICAL DATA:  Acute kidney injury. EXAM: RENAL / URINARY TRACT ULTRASOUND COMPLETE COMPARISON:  04/15/2015 FINDINGS: Right Kidney: Length: 12.6 cm. Mild hydronephrosis. Normal echogenicity of the renal parenchyma. No stones identified. No discrete renal mass seen. Left Kidney: Length: 12.1 cm. The kidney is very difficult to visualize due to location of bowel and body habitus as well as difficulty positioning the patient. Suspected mild left hydronephrosis. Bladder: There is abnormal echogenic filling defect in the urinary bladder. We did not demonstrate any Doppler flow within this multilobular filling defect which is irregular and accordingly difficult to measure. We did not see ureteral jets. IMPRESSION: 1. Abnormal filling defect in the urinary bladder without definite internal flow. This is irregular and probably blood products. 2. We did not see ureteral jets of urine entering the urinary bladder to reassure for complete patency of the ureters. Also, there is bilateral hydronephrosis. 3. Very poor definition of the left kidney due to body habitus, bowel positioning, and difficulty positioning the patient. 4. Renal echogenicity normal on the right and probably normal on the left. Electronically Signed   By: Van Clines M.D.   On: 04/16/2015 11:29     Assessment and Plan: She is doing well with foley catheter drainage with an improving Cr.    We will continue to follow.   Gyn evaluation pending.        LOS: 2 days    Malka So 04/17/2015 [id number]

## 2015-04-17 NOTE — Progress Notes (Signed)
[facility] PROGRESS NOTE  Felicia Grant [id number] DOB: 31-Dec-1945 DOA: 04/15/2015 PCP: Jani Gravel, MD    Code Status: Full code Family Communication: Discussion with guardian, pending. Disposition Plan: Discharge back to ALF when clinically appropriate.   Consultants:  Gynecology, pending  Urology,   Nephrology  Procedures:  None  Antibiotics:  Vancomycin 04/15/15>>  Zosyn 04/15/15>>  HPI/Subjective: Patient says that her lower abdomen feels much much better. She denies nausea or vomiting.  Objective: Filed Vitals:   04/17/15 0630 04/17/15 0818  BP: 128/44   Pulse: 73   Temp:  97 F (36.1 C)  Resp: 14    oxygen saturation 100% on room air.  Intake/Output Summary (Last 24 hours) at 04/17/15 1029 Last data filed at 04/17/15 0819  Gross per 24 hour  Intake   2960 ml  Output   2350 ml  Net    610 ml   Filed Weights   04/15/15 2335 04/16/15 0500 04/17/15 0500  Weight: 87.7 kg (193 lb 5.5 oz) 88.8 kg (195 lb 12.3 oz) 87 kg (191 lb 12.8 oz)    Exam:   General:  Alert [name] in no acute distress. She looks more comfortable.  Cardiovascular: S1, S2, with a soft systolic murmur.  Respiratory: Clear anteriorly with decreased breath sounds in the bases.  GU: On brief exam on 04/16/15, I did not see a urethral mass; per Dr. Alyson Ingles urologist's exam on 04/16/15, there was a palpable friable vagina mass.  Abdomen:  /Suprapubic area significantly less distended and less tender over the lower abdomen;  positive bowel sounds.  Musculoskeletal/extremities: Trace of pedal edema.  Neurologic: She is alert and oriented to herself, but not to the hospital.Her speech appears clear, but slightly pressured this morning.    Data Reviewed: Basic Metabolic Panel:  Recent Labs Lab 04/15/15 1106 04/16/15 0518 04/17/15 0517  NA 134* 139 141  K 5.1 4.9 4.1  CL 103 110 109  CO2 19* 15* 21*  GLUCOSE 208* 188* 261*  BUN 93* 87* 76*   CREATININE 4.95* 4.39* 3.45*  CALCIUM 8.5* 8.2* 8.0*   Liver Function Tests:  Recent Labs Lab 04/15/15 1106 04/16/15 0518  AST 22 22  ALT 10* 12*  ALKPHOS 66 61  BILITOT 0.5 0.6  PROT 6.2* 5.7*  ALBUMIN 3.1* 2.7*   No results for input(s): LIPASE, AMYLASE in the last 168 hours. No results for input(s): AMMONIA in the last 168 hours. CBC:  Recent Labs Lab 04/15/15 1106 04/16/15 0518 04/17/15 0517  WBC 4.6 4.3 3.9*  NEUTROABS 2.2  --   --   HGB 8.3* 7.9* 7.4*  HCT 27.3* 27.4* 24.6*  MCV 71.8* 73.5* 72.4*  PLT 198 192 173   Cardiac Enzymes: No results for input(s): CKTOTAL, CKMB, CKMBINDEX, TROPONINI in the last 168 hours. BNP (last 3 results) No results for input(s): BNP in the last 8760 hours.  ProBNP (last 3 results) No results for input(s): PROBNP in the last 8760 hours.  CBG:  Recent Labs Lab 04/16/15 0742 04/16/15 1122 04/16/15 1620 04/16/15 2128 04/17/15 0737  GLUCAP 156* 153* 209* 255* 225*    Recent Results (from the past 240 hour(s))  Blood Culture (routine x 2)     Status: None (Preliminary result)   Collection Time: 04/15/15 11:07 AM  Result Value Ref Range Status   Specimen Description BLOOD RIGHT HAND DRAWN BY RN  Final   Special Requests BOTTLES DRAWN AEROBIC ONLY 6CC  Final   Culture NO GROWTH 2  DAYS  Final   Report Status PENDING  Incomplete  Blood Culture (routine x 2)     Status: None (Preliminary result)   Collection Time: 04/15/15 11:08 AM  Result Value Ref Range Status   Specimen Description BLOOD RIGHT ANTECUBITAL DRAWN BY RN  Final   Special Requests BOTTLES DRAWN AEROBIC ONLY 5CC  Final   Culture NO GROWTH 2 DAYS  Final   Report Status PENDING  Incomplete  MRSA PCR Screening     Status: None   Collection Time: 04/16/15  3:30 AM  Result Value Ref Range Status   MRSA by PCR NEGATIVE NEGATIVE Final    Comment:        The GeneXpert MRSA Assay (FDA approved for NASAL specimens only), is one component of a comprehensive MRSA  colonization surveillance program. It is not intended to diagnose MRSA infection nor to guide or monitor treatment for MRSA infections.      Studies: Ct Abdomen Pelvis Wo Contrast  04/15/2015  CLINICAL DATA:  70 year old female inpatient with lower abdominal pain, nausea and vomiting. Dysuria. EXAM: CT ABDOMEN AND PELVIS WITHOUT CONTRAST TECHNIQUE: Multidetector CT imaging of the abdomen and pelvis was performed following the standard protocol without IV contrast. COMPARISON:  None. FINDINGS: Lower chest: No significant pulmonary nodules or acute consolidative airspace disease. There is a bulla/pneumatocele at the right lower lobe base. Pacer leads are seen terminating in the right atrium and right ventricular apex. Trace pericardial fluid/thickening. Coronary atherosclerosis. Nonspecific calcifications are present throughout both breasts. Hepatobiliary: Normal liver with no liver mass. Normal gallbladder with no radiopaque cholelithiasis. No biliary ductal dilatation. Pancreas: There is complete fatty replacement of the pancreas, with no pancreatic mass or appreciable pancreatic ductal dilatation. Spleen: Normal size. No mass. Adrenals/Urinary Tract: Mild diffuse thickening of both adrenal glands, without discrete adrenal nodule, suggesting mild adrenal hyperplasia. There is symmetric mild bilateral hydroureteronephrosis. No renal or ureteral stones. No contour deforming renal masses. A prominently distended urinary bladder with diffuse bladder wall thickening and prominent fat stranding surrounding the entire bladder wall. Stomach/Bowel: Grossly normal stomach. Normal caliber small bowel with no small bowel wall thickening. Appendix is not discretely visualized. Normal large bowel with no diverticulosis, large bowel wall thickening or pericolonic fat stranding. Vascular/Lymphatic: Atherosclerotic nonaneurysmal abdominal aorta. No pathologically enlarged lymph nodes in the abdomen or pelvis. Reproductive:  No adnexal mass. The region of the uterus is poorly visualized on this noncontrast study. Uterus could be atrophic or surgically absent. Other: No pneumoperitoneum, ascites or focal fluid collection. Musculoskeletal: No aggressive appearing focal osseous lesions. 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 Moderate degenerative changes in the visualized thoracolumbar spine. IMPRESSION: 1. Prominently distended urinary bladder. Mild bilateral hydroureteronephrosis. Diffuse bladder wall thickening and prominent perivesical fat stranding. Findings suggest acute infectious or inflammatory cystitis and bladder outlet obstruction. Urgent Foley catheter placement advised. 2. Additional findings include coronary atherosclerosis, mild adrenal hyperplasia and severe right hip osteoarthritis. These results were called by telephone at the time of interpretation on 04/15/2015 at 9:45 pm to Dr. Dayna Barker, who verbally acknowledged these results. Electronically Signed   By: Ilona Sorrel M.D.   On: 04/15/2015 21:49   Dg Chest 2 View  04/15/2015  CLINICAL DATA:  Pt sent from assisted living for of vomiting. hypotensive and is currently being tx for UTI. EXAM: CHEST  2 VIEW COMPARISON:  None. FINDINGS: LEFT-sided pacemaker overlies normal cardiac silhouette. Low lung volumes. Central venous congestion. No focal infiltrate or pneumothorax. IMPRESSION: Low lung volumes and  central venous congestion.  No acute findings. Electronically Signed   By: Suzy Bouchard M.D.   On: 04/15/2015 14:40   US Renal  04/16/2015  CLINICAL DATA:  Acute kidney injury. EXAM: RENAL / URINARY TRACT ULTRASOUND COMPLETE COMPARISON:  04/15/2015 FINDINGS: Right Kidney: Length: 12.6 cm. Mild hydronephrosis. Normal echogenicity of the renal parenchyma. No stones identified. No discrete renal mass seen. Left Kidney: Length: 12.1 cm. The kidney is very difficult to visualize due to location of bowel and body habitus as well as difficulty positioning  the patient. Suspected mild left hydronephrosis. Bladder: There is abnormal echogenic filling defect in the urinary bladder. We did not demonstrate any Doppler flow within this multilobular filling defect which is irregular and accordingly difficult to measure. We did not see ureteral jets. IMPRESSION: 1. Abnormal filling defect in the urinary bladder without definite internal flow. This is irregular and probably blood products. 2. We did not see ureteral jets of urine entering the urinary bladder to reassure for complete patency of the ureters. Also, there is bilateral hydronephrosis. 3. Very poor definition of the left kidney due to body habitus, bowel positioning, and difficulty positioning the patient. 4. Renal echogenicity normal on the right and probably normal on the left. Electronically Signed   By: Van Clines M.D.   On: 04/16/2015 11:29    Scheduled Meds: . sodium chloride   Intravenous Once  . antiseptic oral rinse  7 mL Mouth Rinse BID  . furosemide  10 mg Intravenous Once  . insulin aspart  0-9 Units Subcutaneous TID WC  . ondansetron (ZOFRAN) IV  4 mg Intravenous 4 times per day  . pantoprazole (PROTONIX) IV  40 mg Intravenous Daily  . piperacillin-tazobactam (ZOSYN)  IV  2.25 g Intravenous Q8H  . risperiDONE  1 mg Oral QHS  . vancomycin  1,000 mg Intravenous Q48H   Continuous Infusions: . dextrose 5 % 1,000 mL with sodium bicarbonate 100 mEq infusion 75 mL/hr at 04/17/15 0600   Assessment and plan:  Principal Problem:   Acute pyelonephritis Active Problems:   Sepsis ([place])   Lower abdominal pain   Hypotension arterial   Nausea and vomiting   Distended bladder   Vaginal tumor   Hydroureteronephrosis   Lactic acidosis   Diabetes mellitus with complication (HCC)   Metabolic acidosis   Schizoaffective schizophrenia (HCC)   COPD (chronic obstructive pulmonary disease) (HCC)   Parkinson's disease (HCC)   Microcytic anemia   Cardiac pacemaker in situ   1. Urinary  tract infection/possible acute polynephritis causing sepsis and lactic acidosis. Patient's urinalysis on admission was wholly infected appearing. She was hypotensive on arrival to the ED. She was febrile. Blood cultures and a urine culture ordered. She was bolused IV fluids and started on vancomycin and Zosyn. -Her blood pressure is better. -Blood cultures are negative to date. No result on urine culture yet. -We'll narrow antibiotic to Rocephin in the next 24 hours.   Lower abdominal pain/distended abdomen/bilateral hydroureteronephrosis/? Bladder outlet obstruction CT scan of her abdomen and pelvis was ordered to evaluate for lower abdominal pain. Noncontrasted CT revealed prominently distended urinary bladder, mild bilateral hydroureteronephrosis and diffuse bladder wall thickening. Radiology suggested a differential diagnosis of acute infectious or inflammatory cystitis and bladder outlet obstruction. This was the etiology of her pain. -Renal ultrasound revealed echogenic filling defect in the urinary bladder and mild bilateral hydronephrosis. -Foley catheter insertion was attempted by nursing 2, but was unsuccessful.  -Urologist, Dr. Alyson Ingles was consulted. He inserted the Foley  catheter on 04/16/15. He found on exam a anterior friable vaginal mass that was bleeding and invading the urethra. He recommended that the Foley remained intact and be chronic due to the tumor invading the urethra.  Vaginal friable mass with extension to the urethra.  Per exam in Dr. Alyson Ingles, this was found. -We'll order a pelvic ultrasound and consult gynecology.  Acute kidney injury. Patient's baseline creatinine is unknown, however, in 2013, her renal function was within normal limits. She was started on vigorous IV fluids. nephrology was consulted and recommended urology consultation due to likely post renal azotemia. Urology was consulted and inserted a Foley catheter. The catheter promptly drained 800 cc of  cloudy dark urine.  -Etiology of her acute kidney injury is multifactorial including sepsis, ATN, and postobstructive. -Her creatinine has improved some, but will be monitoring for post obstructive diuresis.  Metabolic acidosis. Her CO2 was 19, but has fallen to 15. The acidosis could be secondary to AKI or sepsis or from metformin. Her lactic acidosis was elevated on admission, but did normalize. Metformin was discontinued. -IV fluids were changed and bicarbonate. Her CO2 has improved.   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 Patient is treated with ferrous sulfate chronically. Her hemoglobin was 8.3 on admission. Her MCV was 72. With hydration, her hemoglobin has fallen to 7.4. Urology noted a friable vaginal mass that was bleeding some. Anemia panel revealed a total iron of 5, TIBC of 269, vitamin B12 of 187, folate of 10.8. Her TSH was within normal limits.  We'll add ferrous sulfate twice a day.  -Packed red blood cells transfusion was ordered, but the patient refused secondary to being a Jehovah's Witness.  Borderline vitamin B12 deficiency. Patient's vitamin B12 level was 187. We'll start vitamin B12 injections 3 days.  Essential hypertension. The patient was hypotensive on admission. She is treated chronically with lisinopril and amlodipine; both are on hold until her blood pressure consistently improved.  Type 2 diabetes mellitus. Patient is treated chronically with metformin. It is being held due to acidosis. She was started on sliding scale NovoLog. Her CBGs are trending up.  Her hemoglobin A1c is 8.0. -we'll increase sliding scale NovoLog to moderate scale and at bedtime NovoLog.  Schizoaffective disorder. Currently stable. We'll continue Risperdal.   Time spent: 35  minutes    [facility] Pager [phone]. If 7PM-7AM, please contact night-coverage at [email or web address], password [facility] 04/17/2015, 10:29 AM  LOS: 2 days

## 2015-04-17 NOTE — Clinical Social Work Note (Signed)
Clinical Social Work Assessment  Patient Details  Name: Felicia Grant MRN: [id number] Date of Birth: 1946-01-13  Date of referral:  04/17/15               Reason for consult:  Discharge Planning, Facility Placement                Permission sought to share information with:  Facility Sport and exercise psychologist, Guardian Permission granted to share information::  Yes, Verbal Permission Granted  Name::     Timoteo Ace  Agency::  [facility]  Relationship::     Contact Information:     Housing/Transportation Living arrangements for the past 2 months:  [place] of Information:  Guardian Timoteo Ace: [phone]) Patient Interpreter Needed:  None Criminal Activity/Legal Involvement Pertinent to Current Situation/Hospitalization:  No - Comment as needed Significant Relationships:    Lives with:  Facility Resident Do you feel safe going back to the place where you live?  Yes Need for family participation in patient care:  Yes (Comment)  Care giving concerns:  The patient's guardian plans for the patient to return to [facility] at discharge.   Social Worker assessment / plan:  CSW spoke with the patient's guardian Timoteo Ace to complete assessment. Adonis Huguenin states that she is the owner of [employer] which contracts with the state to provide guardianship services. Per Adonis Huguenin, the patient is a resident of [facility] and Champ for the patient to return to [facility] at discharge. CSW explained CSW role and process of getting the patient back to the facility. CSW will continue to follow.  Employment status:  Disabled (Comment on whether or not currently receiving Disability) Insurance information:  Medicare, Medicaid In [place] PT Recommendations:  Not assessed at this time Information / Referral to community resources:  Other (Comment Required) (CSW will send patient's information to [facility].)  Patient/Family's Response to care:  The patient's  guardian states that she is happy with the care the patient has received.  Patient/Family's Understanding of and Emotional Response to Diagnosis, Current Treatment, and Prognosis:  The patient appears to have very limited insight at this time. Adonis Huguenin appears to have a good understanding of the reason for the patient's admission and post DC needs.   Emotional Assessment Appearance:  Appears stated age Attitude/Demeanor/Rapport:  Unable to Assess Affect (typically observed):  Unable to Assess Orientation:    Alcohol / Substance use:  Never Used Psych involvement (Current and /or in the community):  No (Comment)  Discharge Needs  Concerns to be addressed:  Discharge Planning Concerns Readmission within the last 30 days:  No Current discharge risk:  Chronically ill, Cognitively Impaired Barriers to Discharge:  Continued Medical Work up   [place], LCSW 04/17/2015, 1:46 PM

## 2015-04-17 NOTE — Care Management Important Message (Signed)
Important Message  Patient Details  Name: Bre Getchell MRN: [id number] Date of Birth: 1945-11-02   Medicare Important Message Given:  Yes    Sherald Barge, RN 04/17/2015, 4:45 PM

## 2015-04-18 DIAGNOSIS — C579 Malignant neoplasm of female genital organ, unspecified: Secondary | ICD-10-CM | POA: Diagnosis present

## 2015-04-18 LAB — BASIC METABOLIC PANEL
ANION GAP: 8 (ref 5–15)
BUN: 35 mg/dL — ABNORMAL HIGH (ref 6–20)
CALCIUM: 7.9 mg/dL — AB (ref 8.9–10.3)
CO2: 25 mmol/L (ref 22–32)
Chloride: 106 mmol/L (ref 101–111)
Creatinine, Ser: 1.46 mg/dL — ABNORMAL HIGH (ref 0.44–1.00)
GFR calc non Af Amer: 35 mL/min — ABNORMAL LOW (ref >60)
GFR, EST AFRICAN AMERICAN: 41 mL/min — AB (ref >60)
Glucose, Bld: 175 mg/dL — ABNORMAL HIGH (ref 65–99)
POTASSIUM: 3.5 mmol/L (ref 3.5–5.1)
Sodium: 139 mmol/L (ref 135–145)

## 2015-04-18 LAB — GLUCOSE, CAPILLARY
Glucose-Capillary: 171 mg/dL — ABNORMAL HIGH (ref 65–99)
Glucose-Capillary: 177 mg/dL — ABNORMAL HIGH (ref 65–99)
Glucose-Capillary: 158 mg/dL — ABNORMAL HIGH (ref 65–99)
Glucose-Capillary: 152 mg/dL — ABNORMAL HIGH (ref 65–99)

## 2015-04-18 LAB — TYPE AND SCREEN
ABO/RH(D): O POS
ANTIBODY SCREEN: NEGATIVE
UNIT DIVISION: 0
Unit division: 0

## 2015-04-18 LAB — URINE CULTURE: Culture: >=100000

## 2015-04-18 LAB — CBC
HEMATOCRIT: 29.6 % — AB (ref 36.0–46.0)
HEMOGLOBIN: 9.3 g/dL — AB (ref 12.0–15.0)
MCH: 23.3 pg — ABNORMAL LOW (ref 26.0–34.0)
MCHC: 31.4 g/dL (ref 30.0–36.0)
MCV: 74.2 fL — ABNORMAL LOW (ref 78.0–100.0)
Platelets: 181 10*3/uL (ref 150–400)
RBC: 3.99 MIL/uL (ref 3.87–5.11)
RDW: 18.5 % — AB (ref 11.5–15.5)
WBC: 3.8 10*3/uL — ABNORMAL LOW (ref 4.0–10.5)

## 2015-04-18 MED ORDER — AMLODIPINE BESYLATE 5 MG PO TABS
5.0000 mg | ORAL_TABLET | Freq: Every day | ORAL | Status: DC
  Administered 2015-04-18 – 2015-04-21 (×4): 5 mg via ORAL
  Filled 2015-04-18 (×4): qty 1

## 2015-04-18 MED ORDER — FERROUS SULFATE 325 (65 FE) MG PO TABS
325.0000 mg | ORAL_TABLET | Freq: Every day | ORAL | Status: DC
  Administered 2015-04-19 – 2015-04-21 (×3): 325 mg via ORAL
  Filled 2015-04-18 (×3): qty 1

## 2015-04-18 MED ORDER — SODIUM CHLORIDE 0.9 % IV SOLN
INTRAVENOUS | Status: DC
  Administered 2015-04-18: 18:00:00 via INTRAVENOUS

## 2015-04-18 MED ORDER — PIPERACILLIN-TAZOBACTAM 3.375 G IVPB
3.3750 g | Freq: Three times a day (TID) | INTRAVENOUS | Status: DC
  Administered 2015-04-18 – 2015-04-19 (×3): 3.375 g via INTRAVENOUS
  Filled 2015-04-18 (×3): qty 50

## 2015-04-18 NOTE — Progress Notes (Signed)
[facility] PROGRESS NOTE  Felicia Grant [id number] DOB: 24-Nov-1945 DOA: 04/15/2015 PCP: Jani Gravel, MD    Code Status: Full code Family Communication: Discussion with guardian, pending. Disposition Plan: Discharge back to ALF when clinically appropriate.   Consultants:  Gynecology  Urology,   Nephrology  Procedures:  None  Antibiotics:  Vancomycin 04/15/15>> 04/18/15.  Zosyn 04/15/15>>  HPI/Subjective: Patient denies chest pain, nausea, vomiting, or abdominal pain.  Objective: Filed Vitals:   04/18/15 0800 04/18/15 0854  BP: 141/59   Pulse: 65   Temp:  97.5 F (36.4 C)  Resp: 23    oxygen saturation 100% on room air.  Intake/Output Summary (Last 24 hours) at 04/18/15 0900 Last data filed at 04/18/15 0854  Gross per 24 hour  Intake   2611 ml  Output   3000 ml  Net   -389 ml   Filed Weights   04/16/15 0500 04/17/15 0500 04/18/15 0500  Weight: 88.8 kg (195 lb 12.3 oz) 87 kg (191 lb 12.8 oz) 86.9 kg (191 lb 9.3 oz)    Exam:   General:  Alert [name] in no acute distress. She looks more comfortable.  Cardiovascular: S1, S2, with a soft systolic murmur.  Respiratory: Clear anteriorly with decreased breath sounds in the bases.  GU: On brief exam on 04/16/15, I did not see a urethral mass; per Dr. Alyson Ingles urologist's exam on 04/16/15, there was a palpable friable vagina mass. Exam per GYN Dr. Glo Herring on 04/17/15 showed an entire right vaginal wall involvement with a firm irregular fixed mass that extends down to the vaginal origin.  Abdomen: Suprapubic area significantly less distended and less tender over the lower abdomen;  positive bowel sounds. GU: Pink-colored urine in the Foley bag.  Musculoskeletal/extremities: Trace of pedal edema.  Neurologic: She is alert and oriented to herself, but not to the hospital. 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 She calls it "plaque".    Data  Reviewed: Basic Metabolic Panel:  Recent Labs Lab 04/15/15 1106 04/16/15 0518 04/17/15 0517 04/18/15 0439  NA 134* 139 141 139  K 5.1 4.9 4.1 3.5  CL 103 110 109 106  CO2 19* 15* 21* 25  GLUCOSE 208* 188* 261* 175*  BUN 93* 87* 76* 35*  CREATININE 4.95* 4.39* 3.45* 1.46*  CALCIUM 8.5* 8.2* 8.0* 7.9*   Liver Function Tests:  Recent Labs Lab 04/15/15 1106 04/16/15 0518  AST 22 22  ALT 10* 12*  ALKPHOS 66 61  BILITOT 0.5 0.6  PROT 6.2* 5.7*  ALBUMIN 3.1* 2.7*   No results for input(s): LIPASE, AMYLASE in the last 168 hours. No results for input(s): AMMONIA in the last 168 hours. CBC:  Recent Labs Lab 04/15/15 1106 04/16/15 0518 04/17/15 0517 04/17/15 2018 04/18/15 0439  WBC 4.6 4.3 3.9* 4.2 3.8*  NEUTROABS 2.2  --   --   --   --   HGB 8.3* 7.9* 7.4* 9.9* 9.3*  HCT 27.3* 27.4* 24.6* 31.8* 29.6*  MCV 71.8* 73.5* 72.4* 74.8* 74.2*  PLT 198 192 173 167 181   Cardiac Enzymes: No results for input(s): CKTOTAL, CKMB, CKMBINDEX, TROPONINI in the last 168 hours. BNP (last 3 results) No results for input(s): BNP in the last 8760 hours.  ProBNP (last 3 results) No results for input(s): PROBNP in the last 8760 hours.  CBG:  Recent Labs Lab 04/17/15 0737 04/17/15 1211 04/17/15 1729 04/17/15 2030 04/18/15 0751  GLUCAP 225* 238* 202* 220* 171*  Recent Results (from the past 240 hour(s))  Blood Culture (routine x 2)     Status: None (Preliminary result)   Collection Time: 04/15/15 11:07 AM  Result Value Ref Range Status   Specimen Description BLOOD RIGHT HAND DRAWN BY RN  Final   Special Requests BOTTLES DRAWN AEROBIC ONLY 6CC  Final   Culture NO GROWTH 3 DAYS  Final   Report Status PENDING  Incomplete  Blood Culture (routine x 2)     Status: None (Preliminary result)   Collection Time: 04/15/15 11:08 AM  Result Value Ref Range Status   Specimen Description BLOOD RIGHT ANTECUBITAL DRAWN BY RN  Final   Special Requests BOTTLES DRAWN AEROBIC ONLY 5CC   Final   Culture NO GROWTH 3 DAYS  Final   Report Status PENDING  Incomplete  Urine culture     Status: None (Preliminary result)   Collection Time: 04/15/15  1:08 PM  Result Value Ref Range Status   Specimen Description URINE, CLEAN CATCH  Final   Special Requests NONE  Final   Culture   Final    >=100,000 COLONIES/mL PROTEUS MIRABILIS Performed at [facility]    Report Status PENDING  Incomplete  MRSA PCR Screening     Status: None   Collection Time: 04/16/15  3:30 AM  Result Value Ref Range Status   MRSA by PCR NEGATIVE NEGATIVE Final    Comment:        The GeneXpert MRSA Assay (FDA approved for NASAL specimens only), is one component of a comprehensive MRSA colonization surveillance program. It is not intended to diagnose MRSA infection nor to guide or monitor treatment for MRSA infections.      Studies: US Pelvis Complete  04/17/2015  CLINICAL DATA:  Vaginal mass with extension to urethra on physical exam EXAM: TRANSABDOMINAL ULTRASOUND OF PELVIS TECHNIQUE: Transabdominal ultrasound examination of the pelvis was performed including evaluation of the uterus, ovaries, adnexal regions, and pelvic cul-de-sac. COMPARISON:  None. FINDINGS: Uterus Not visualized. Right ovary Not discretely visualized. Left ovary Not discretely visualized. Other findings: Bladder is notable for an indwelling Foley catheter. IMPRESSION: Uterus and bilateral ovaries are not discretely visualized. Bladder is notable for an indwelling Foley catheter. Electronically Signed   By: Julian Hy M.D.   On: 04/17/2015 12:58   US Renal  04/16/2015  CLINICAL DATA:  Acute kidney injury. EXAM: RENAL / URINARY TRACT ULTRASOUND COMPLETE COMPARISON:  04/15/2015 FINDINGS: Right Kidney: Length: 12.6 cm. Mild hydronephrosis. Normal echogenicity of the renal parenchyma. No stones identified. No discrete renal mass seen. Left Kidney: Length: 12.1 cm. The kidney is very difficult to visualize due to location of  bowel and body habitus as well as difficulty positioning the patient. Suspected mild left hydronephrosis. Bladder: There is abnormal echogenic filling defect in the urinary bladder. We did not demonstrate any Doppler flow within this multilobular filling defect which is irregular and accordingly difficult to measure. We did not see ureteral jets. IMPRESSION: 1. Abnormal filling defect in the urinary bladder without definite internal flow. This is irregular and probably blood products. 2. We did not see ureteral jets of urine entering the urinary bladder to reassure for complete patency of the ureters. Also, there is bilateral hydronephrosis. 3. Very poor definition of the left kidney due to body habitus, bowel positioning, and difficulty positioning the patient. 4. Renal echogenicity normal on the right and probably normal on the left. Electronically Signed   By: Van Clines M.D.   On: 04/16/2015 11:29  Scheduled Meds: . antiseptic oral rinse  7 mL Mouth Rinse BID  . cyanocobalamin  1,000 mcg Intramuscular Daily  . insulin aspart  0-15 Units Subcutaneous TID WC  . insulin aspart  0-5 Units Subcutaneous QHS  . ondansetron (ZOFRAN) IV  4 mg Intravenous 4 times per day  . pantoprazole (PROTONIX) IV  40 mg Intravenous Daily  . piperacillin-tazobactam (ZOSYN)  IV  2.25 g Intravenous Q8H  . risperiDONE  1 mg Oral QHS  . sodium chloride flush  3 mL Intravenous Q12H  . vancomycin  1,000 mg Intravenous Q48H   Continuous Infusions: . dextrose 5 % 1,000 mL with sodium bicarbonate 100 mEq infusion 75 mL/hr at 04/17/15 2049   Assessment and plan:  Principal Problem:   Acute pyelonephritis Active Problems:   Sepsis ([place])   Lower abdominal pain   Hypotension arterial   Nausea and vomiting   Distended bladder   Vaginal tumor   Hydroureteronephrosis   Lactic acidosis   Diabetes mellitus with complication (HCC)   Metabolic acidosis   Schizoaffective schizophrenia (HCC)   COPD (chronic  obstructive pulmonary disease) (HCC)   Parkinson's disease (HCC)   Microcytic anemia   Cardiac pacemaker in situ   1. Urinary tract infection/possible acute polynephritis causing sepsis and lactic acidosis. Patient's urinalysis on admission was wholly infected appearing. She was hypotensive on arrival to the ED. She was febrile. Blood cultures and a urine culture ordered. She was bolused IV fluids and started on vancomycin and Zosyn. -Her blood pressure is better. -Blood cultures are negative to date. urine culture reveals Proteus. -We'll discontinue vancomycin. Will narrow antibiotics once sensitivities on the urine culture have been resulted.    Lower abdominal pain/distended abdomen/bilateral hydroureteronephrosis/? Bladder outlet obstruction CT scan of her abdomen and pelvis was ordered to evaluate for lower abdominal pain. Noncontrasted CT revealed prominently distended urinary bladder, mild bilateral hydroureteronephrosis and diffuse bladder wall thickening. Radiology suggested a differential diagnosis of acute infectious or inflammatory cystitis and bladder outlet obstruction. This was the etiology of her pain. -Renal ultrasound revealed echogenic filling defect in the urinary bladder and mild bilateral hydronephrosis. -Foley catheter insertion was attempted by nursing 2, but was unsuccessful.  -Urologist, Dr. Alyson Ingles was consulted. He inserted the Foley catheter on 04/16/15. He found on exam a anterior friable vaginal mass that was bleeding and invading the urethra. He recommended that the Foley remained intact and be chronic due to the tumor invading the urethra.  Vaginal friable mass with extension to the urethra.  Per exam by Dr. Alyson Ingles, this was found. Pelvic ultrasound was ordered and revealed nonvisualization of the uterus and ovaries. GYN, Dr. Glo Herring was consulted. On exam, he palpated a large firm vaginal mass extended down to the introitus. 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 He discussed the patient's case with GYN-ONC Dr. Denman George at [facility]. He will work with Dr. Denman George over the weekend to arrange a planned which may involve consideration for radiation therapy once the diagnosis is fully established.   Acute kidney injury. Patient's baseline creatinine is unknown, however, in 2013, her renal function was within normal limits. She was started on vigorous IV fluids. nephrology was consulted and recommended urology consultation due to likely post renal azotemia. Urology was consulted and inserted a Foley catheter. The catheter promptly drained 800 cc of cloudy dark urine.  -Etiology of her acute kidney injury is multifactorial including sepsis, ATN, and postobstructive. -Her creatinine has improved some progressively.  We'll need to continue  monitoring for post obstructive diuresis. -Nephrology has changed her IV fluids to D5 with bicarbonate added.  Metabolic acidosis. Her CO2 was 19, but has fallen to 15. The acidosis could be secondary to AKI or sepsis or from metformin. Her lactic acidosis was elevated on admission, but did normalize. Metformin was discontinued. -IV fluids were changed and bicarbonate. Her CO2 has improved. -We'll defer changing IV fluids to nephrology.    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 Patient is treated with ferrous sulfate chronically. Her hemoglobin was 8.3 on admission. Her MCV was 72. With hydration, her hemoglobin has fallen to 7.4. Urology noted a friable vaginal mass that was bleeding some. Anemia panel revealed a total iron of 5, TIBC of 269, vitamin B12 of 187, folate of 10.8. Her TSH was within normal limits.  Ferrous sulfate started once daily.  -Packed red blood cells transfusion was ordered and approved by her guardian. -Patient's hemoglobin improved appropriately.   Borderline vitamin B12 deficiency. Patient's vitamin B12 level was 187. She was  started on  vitamin B12 injections 3 days.  Essential hypertension. The patient was hypotensive on admission. She is treated chronically with lisinopril and amlodipine; both were held on admission. With improvement in her blood pressure, will restart amlodipine.    Type 2 diabetes mellitus. Patient is treated chronically with metformin. It is being held due to acidosis. She was started on sliding scale NovoLog. Her CBGs are trending up.  Her hemoglobin A1c is 8.0. -Sliding scale NovoLog was increased to moderate scale and added  bedtime NovoLog. CBGs have improved.   Schizoaffective disorder. Currently stable. on 04/18/15 ration stated "I don't have a tumor", it's "plaque". We'll continue Risperdal.   Time spent: 30  minutes    [facility] Pager [phone]. If 7PM-7AM, please contact night-coverage at [email or web address], password [facility] 04/18/2015, 9:00 AM  LOS: 3 days

## 2015-04-18 NOTE — Progress Notes (Signed)
Pharmacy Antibiotic Note  Felicia Grant is a 70 y.o. female admitted on 04/15/2015 with sepsis.  Pharmacy has been consulted for [place] dosing. Vancomycin discontinued this morning Renal has improved  Estimated Creatinine Clearance: 37.7 mL/min (by C-G formula based on Cr of 1.46).  Plan: Change Zosyn dosing to 3.375 GM IV every 8 hours, each dose over 4 hours Monitor renal functions F/U cultures  )Height: 5\' 4"  (162.6 cm) Weight: 191 lb 9.3 oz (86.9 kg) IBW/kg (Calculated) : 54.7  Temp (24hrs), Avg:98.1 F (36.7 C), Min:97.1 F (36.2 C), Max:99.9 F (37.7 C)   Recent Labs Lab 04/15/15 1106 04/15/15 1125 04/15/15 1359 04/16/15 0518 04/17/15 0517 04/17/15 2018 04/18/15 0439  WBC 4.6  --   --  4.3 3.9* 4.2 3.8*  CREATININE 4.95*  --   --  4.39* 3.45*  --  1.46*  LATICACIDVEN  --  2.41* 1.70  --   --   --   --     No Known Allergies  Anti-infectives    Start     Dose/Rate Route Frequency Ordered Stop   04/18/15 1600  piperacillin-tazobactam (ZOSYN) IVPB 3.375 g     3.375 g 12.5 mL/hr over 240 Minutes Intravenous Every 8 hours 04/18/15 0932     04/17/15 1200  vancomycin (VANCOCIN) IVPB 1000 mg/200 mL premix  Status:  Discontinued     1,000 mg 200 mL/hr over 60 Minutes Intravenous Every 48 hours 04/16/15 0842 04/18/15 0912   04/16/15 0830  piperacillin-tazobactam (ZOSYN) 2.25 g in dextrose 5 % 50 mL IVPB  Status:  Discontinued     2.25 g 100 mL/hr over 30 Minutes Intravenous Every 8 hours 04/16/15 0806 04/18/15 0932   04/15/15 1145  vancomycin (VANCOCIN) 1,500 mg in sodium chloride 0.9 % 500 mL IVPB     1,500 mg 250 mL/hr over 120 Minutes Intravenous  Once 04/15/15 1111 04/15/15 1343   04/15/15 1100  piperacillin-tazobactam (ZOSYN) IVPB 3.375 g     3.375 g 100 mL/hr over 30 Minutes Intravenous  Once 04/15/15 1051 04/15/15 1231   04/15/15 1100  vancomycin (VANCOCIN) IVPB 1000 mg/200 mL premix  Status:  Discontinued     1,000 mg 200 mL/hr over 60  Minutes Intravenous  Once 04/15/15 1051 04/15/15 1111     Results for orders placed or performed during the hospital encounter of 04/15/15  Blood Culture (routine x 2)     Status: None (Preliminary result)   Collection Time: 04/15/15 11:07 AM  Result Value Ref Range Status   Specimen Description BLOOD RIGHT HAND DRAWN BY RN  Final   Special Requests BOTTLES DRAWN AEROBIC ONLY 6CC  Final   Culture NO GROWTH 3 DAYS  Final   Report Status PENDING  Incomplete  Blood Culture (routine x 2)     Status: None (Preliminary result)   Collection Time: 04/15/15 11:08 AM  Result Value Ref Range Status   Specimen Description BLOOD RIGHT ANTECUBITAL DRAWN BY RN  Final   Special Requests BOTTLES DRAWN AEROBIC ONLY 5CC  Final   Culture NO GROWTH 3 DAYS  Final   Report Status PENDING  Incomplete  Urine culture     Status: None (Preliminary result)   Collection Time: 04/15/15  1:08 PM  Result Value Ref Range Status   Specimen Description URINE, CLEAN CATCH  Final   Special Requests NONE  Final   Culture   Final    >=100,000 COLONIES/mL PROTEUS MIRABILIS Performed at [facility]    Report  Status PENDING  Incomplete  MRSA PCR Screening     Status: None   Collection Time: 04/16/15  3:30 AM  Result Value Ref Range Status   MRSA by PCR NEGATIVE NEGATIVE Final    Comment:        The GeneXpert MRSA Assay (FDA approved for NASAL specimens only), is one component of a comprehensive MRSA colonization surveillance program. It is not intended to diagnose MRSA infection nor to guide or monitor treatment for MRSA infections.    Thank you for allowing pharmacy to be a part of this patient's care.  Chriss Czar 04/18/2015 9:33 AM

## 2015-04-18 NOTE — Progress Notes (Signed)
Pt. Arrived to unit rm#[id number].  Report received from [facility].

## 2015-04-18 NOTE — Progress Notes (Signed)
Subjective: Patient reports tolerating PO.  See Dr Sabino Snipes note  Objective: I have reviewed patient's vital signs, intake and output, labs and microbiology. Bun improving.     Assessment/Plan: Pt may be moved to less acute care  Regular floor, once medical status improves.   I will obtain biopsy tomorrow if i can speak to appropriate guardian. I expect pt to be here as inpt thru weekend.  LOS: 3 days    Felicia Grant V 04/18/2015, 10:53 AM

## 2015-04-18 NOTE — Progress Notes (Signed)
Felicia Grant  MRN: [id number]  DOB/AGE: [birth year] 72 y.o.  Primary Care Physician:[name], MD  Admit date: 04/15/2015  Chief Complaint:  Chief Complaint  Patient presents with  . Code Sepsis    S-Pt presented on  04/15/2015 with  Chief Complaint  Patient presents with  . Code Sepsis  .    Pt today feels better.Pt voices no new concerns.  Meds . amLODipine  5 mg Oral Daily  . antiseptic oral rinse  7 mL Mouth Rinse BID  . cyanocobalamin  1,000 mcg Intramuscular Daily  . [START ON 04/19/2015] ferrous sulfate  325 mg Oral Q breakfast  . insulin aspart  0-15 Units Subcutaneous TID WC  . insulin aspart  0-5 Units Subcutaneous QHS  . ondansetron (ZOFRAN) IV  4 mg Intravenous 4 times per day  . pantoprazole (PROTONIX) IV  40 mg Intravenous Daily  . piperacillin-tazobactam (ZOSYN)  IV  3.375 g Intravenous Q8H  . risperiDONE  1 mg Oral QHS  . sodium chloride flush  3 mL Intravenous Q12H      Physical Exam: Vital signs in last 24 hours: Temp:  [97.1 F (36.2 C)-99.9 F (37.7 C)] 97.5 F (36.4 C) (03/04 0854) Pulse Rate:  [52-87] 59 (03/04 0900) Resp:  [12-27] 20 (03/04 0900) BP: (80-141)/(32-115) 137/56 mmHg (03/04 0900) SpO2:  [71 %-100 %] 100 % (03/04 0900) Weight:  [191 lb 9.3 oz (86.9 kg)] 191 lb 9.3 oz (86.9 kg) (03/04 0500) Weight change: -3.5 oz (-0.1 kg) Last BM Date: 04/15/15  Intake/Output from previous day: 03/03 0701 - 03/04 0700 In: 2371 [I.V.:1606; Blood:715; IV Piggyback:50] Out: [id number] [Urine:3600] Total I/O In: 240 [P.O.:240] Out: -    Physical Exam: General- pt is awake,follws commands. Resp- No acute REsp distress, Rhonchi + CVS- S1S2 regular in rate and rhythm GIT- BS+, softer than yesterday , NT, EXT- NO LE Edema, NO Cyanosis   Lab Results: CBC  Recent Labs  04/17/15 2018 04/18/15 0439  WBC 4.2 3.8*  HGB 9.9* 9.3*  HCT 31.8* 29.6*  PLT 167 181    BMET  Recent Labs  04/17/15 0517 04/18/15 0439  NA 141 139  K 4.1 3.5   CL 109 106  CO2 21* 25  GLUCOSE 261* 175*  BUN 76* 35*  CREATININE 3.45* 1.46*  CALCIUM 8.0* 7.9*   Creat trend 2017 4.95=>4.39=>3.45=>1.46   MICRO Recent Results (from the past 240 hour(s))  Blood Culture (routine x 2)     Status: None (Preliminary result)   Collection Time: 04/15/15 11:07 AM  Result Value Ref Range Status   Specimen Description BLOOD RIGHT HAND DRAWN BY RN  Final   Special Requests BOTTLES DRAWN AEROBIC ONLY 6CC  Final   Culture NO GROWTH 3 DAYS  Final   Report Status PENDING  Incomplete  Blood Culture (routine x 2)     Status: None (Preliminary result)   Collection Time: 04/15/15 11:08 AM  Result Value Ref Range Status   Specimen Description BLOOD RIGHT ANTECUBITAL DRAWN BY RN  Final   Special Requests BOTTLES DRAWN AEROBIC ONLY 5CC  Final   Culture NO GROWTH 3 DAYS  Final   Report Status PENDING  Incomplete  Urine culture     Status: None (Preliminary result)   Collection Time: 04/15/15  1:08 PM  Result Value Ref Range Status   Specimen Description URINE, CLEAN CATCH  Final   Special Requests NONE  Final   Culture   Final    >=100,000 COLONIES/mL PROTEUS MIRABILIS  Performed at [facility]    Report Status PENDING  Incomplete  MRSA PCR Screening     Status: None   Collection Time: 04/16/15  3:30 AM  Result Value Ref Range Status   MRSA by PCR NEGATIVE NEGATIVE Final    Comment:        The GeneXpert MRSA Assay (FDA approved for NASAL specimens only), is one component of a comprehensive MRSA colonization surveillance program. It is not intended to diagnose MRSA infection nor to guide or monitor treatment for MRSA infections.       Lab Results  Component Value Date   CALCIUM 7.9* 04/18/2015        Impression: 1)Renal AKI secondary to Prerenal/ATN and Post renal   Multiple etiology data  ATN- hypotensive + ACE on board + UTI  Post renal- urine retention + Renal u/s + CT shows  hydro    AKi better    Creat trending down  2)CVs- hemodynamically better   3)Anemia HGb stable  4)Urology- pt admitted with B/l hydro  Primary team and urology being consulted.  5)ID- admitted with possible sepsis from pyelonephritis  PMD following  6)Electrolytes  Hyperkalemic  Now better  NOrmonatremic   7)Acid base Co2 now  at goal    Plan:  AKi improving Will d/c IV Bicarb Will change to IVf with reduced rate.     Temara Lanum S 04/18/2015, 9:41 AM

## 2015-04-18 NOTE — Progress Notes (Addendum)
Report called to Rosealee Albee on 300. Patient ready for transport to MedSurg floor. Attempted to notify legal guardian Shanon Payor no answer

## 2015-04-19 DIAGNOSIS — C579 Malignant neoplasm of female genital organ, unspecified: Secondary | ICD-10-CM

## 2015-04-19 LAB — CBC
HEMATOCRIT: 33.8 % — AB (ref 36.0–46.0)
Hemoglobin: 10 g/dL — ABNORMAL LOW (ref 12.0–15.0)
MCH: 22.5 pg — ABNORMAL LOW (ref 26.0–34.0)
MCHC: 29.6 g/dL — ABNORMAL LOW (ref 30.0–36.0)
MCV: 76.1 fL — AB (ref 78.0–100.0)
PLATELETS: 188 10*3/uL (ref 150–400)
RBC: 4.44 MIL/uL (ref 3.87–5.11)
RDW: 18.9 % — ABNORMAL HIGH (ref 11.5–15.5)
WBC: 3 10*3/uL — ABNORMAL LOW (ref 4.0–10.5)

## 2015-04-19 LAB — GLUCOSE, CAPILLARY
GLUCOSE-CAPILLARY: 154 mg/dL — AB (ref 65–99)
GLUCOSE-CAPILLARY: 156 mg/dL — AB (ref 65–99)
GLUCOSE-CAPILLARY: 166 mg/dL — AB (ref 65–99)
Glucose-Capillary: 154 mg/dL — ABNORMAL HIGH (ref 65–99)

## 2015-04-19 LAB — BASIC METABOLIC PANEL
ANION GAP: 8 (ref 5–15)
BUN: 16 mg/dL (ref 6–20)
CALCIUM: 7.5 mg/dL — AB (ref 8.9–10.3)
CHLORIDE: 103 mmol/L (ref 101–111)
CO2: 30 mmol/L (ref 22–32)
CREATININE: 0.98 mg/dL (ref 0.44–1.00)
GFR calc non Af Amer: 57 mL/min — ABNORMAL LOW (ref >60)
Glucose, Bld: 179 mg/dL — ABNORMAL HIGH (ref 65–99)
Potassium: 3.6 mmol/L (ref 3.5–5.1)
Sodium: 141 mmol/L (ref 135–145)

## 2015-04-19 NOTE — Progress Notes (Signed)
Pt.'s legal guardian contacted.  Mrs. Felicia Grant will be available for contact at [phone].  Mrs. Felicia Grant gave verbal agreement over the phone for any procedure required for Mrs. Felicia Grant.  I informed her that Dr. Newman Pies would call her today for that information as well.

## 2015-04-19 NOTE — Progress Notes (Signed)
Felicia Grant Guardian/ contact number [phone] and [phone]

## 2015-04-19 NOTE — Progress Notes (Signed)
Subjective: Patient presently denies any nausea or vomiting. Over all she feels better and offers no Complaints.   Objective: Vital signs in last 24 hours: Temp:  [97.4 F (36.3 C)-99 F (37.2 C)] 99 F (37.2 C) (03/05 0501) Pulse Rate:  [59-80] 62 (03/05 0501) Resp:  [16-22] 20 (03/05 0501) BP: (117-148)/(51-66) 136/54 mmHg (03/05 0501) SpO2:  [93 %-100 %] 94 % (03/05 0501) Weight:  [187 lb 9.6 oz (85.095 kg)] 187 lb 9.6 oz (85.095 kg) (03/05 0635)  Intake/Output from previous day: 03/04 0701 - 03/05 0700 In: 1805 [P.O.:480; I.V.:1275; IV Piggyback:50] Out: 1700 [Urine:1700] Intake/Output this shift: Total I/O In: 120 [P.O.:120] Out: -    Recent Labs  04/17/15 0517 04/17/15 2018 04/18/15 0439 04/19/15 0654  HGB 7.4* 9.9* 9.3* 10.0*    Recent Labs  04/18/15 0439 04/19/15 0654  WBC 3.8* 3.0*  RBC 3.99 4.44  HCT 29.6* 33.8*  PLT 181 188    Recent Labs  04/18/15 0439 04/19/15 0654  NA 139 141  K 3.5 3.6  CL 106 103  CO2 25 30  BUN 35* 16  CREATININE 1.46* 0.98  GLUCOSE 175* 179*  CALCIUM 7.9* 7.5*   No results for input(s): LABPT, INR in the last 72 hours.  Generally patient is alert and in no apparent distress Chest is clear to auscultation Heart exam regular rate and rhythm no murmur Extremities no edema  Assessment/Plan: Problem #1 acute kidney injury: Possibly ATN versus prerenal. Presently her renal function has recovered. Problem #2 history of anemia: Most likely iron deficiency. Her hemoglobin has improved Problem #3 metabolic acidosis: Her CO2 has corrected. Problem #4 pyelonephritis Problem #5 history of diabetes Problem #6 history of schizophrenia Problem #7 history of COPD Plan: 1]We'll DC IV fluid 2] since her renal function has recovered I will sign off.   Thank you for letting me participate  her care.   Felice Hope S 04/19/2015, 9:13 AM

## 2015-04-19 NOTE — Progress Notes (Addendum)
[facility] PROGRESS NOTE  Felicia Grant [id number] DOB: [birth year] DOA: 04/15/2015 PCP: Felicia Gravel, MD    Code Status: Full code Family Communication: Discussion with guardian, Timoteo Ace on 04/19/15. Disposition Plan: Discharge back to ALF when clinically appropriate.   Consultants:  Gynecology  Urology,   Nephrology  Procedures:  None  Antibiotics:  Rocephin 3/5>>  Vancomycin 04/15/15>> 04/18/15.  Zosyn 04/15/15>>04/19/15  HPI/Subjective: Patient denies abdominal pain.  Objective: Filed Vitals:   04/18/15 2125 04/19/15 0501  BP: 146/51 136/54  Pulse: 60 62  Temp: 99 F (37.2 C) 99 F (37.2 C)  Resp: 20 20   oxygen saturation 100% on room air.  Intake/Output Summary (Last 24 hours) at 04/19/15 1422 Last data filed at 04/19/15 0910  Gross per 24 hour  Intake   1095 ml  Output   1200 ml  Net   -105 ml   Filed Weights   04/17/15 0500 04/18/15 0500 04/19/15 0635  Weight: 87 kg (191 lb 12.8 oz) 86.9 kg (191 lb 9.3 oz) 85.095 kg (187 lb 9.6 oz)    Exam:   General:  Alert [name] in no acute distress. She looks more comfortable.  Cardiovascular: S1, S2, with a soft systolic murmur.  Respiratory: Clear anteriorly with decreased breath sounds in the bases.  GU: Per Dr. Alyson Ingles urologist's exam on 04/16/15, there was a palpable friable vagina mass. Exam per GYN Dr. Glo Herring on 04/17/15 showed an entire right vaginal wall involvement with a firm irregular fixed mass that extends down to the vaginal origin.  Abdomen: Suprapubic area significantly less distended and less tender over the lower abdomen;  positive bowel sounds. GU: Pink-colored urine in the Foley bag.  Musculoskeletal/extremities: Trace of pedal edema.  Neurologic: She is alert and oriented to herself, but not to the hospital. 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 She called it "plaque".    Data Reviewed: Basic Metabolic  Panel:  Recent Labs Lab 04/15/15 1106 04/16/15 0518 04/17/15 0517 04/18/15 0439 04/19/15 0654  NA 134* 139 141 139 141  K 5.1 4.9 4.1 3.5 3.6  CL 103 110 109 106 103  CO2 19* 15* 21* 25 30  GLUCOSE 208* 188* 261* 175* 179*  BUN 93* 87* 76* 35* 16  CREATININE 4.95* 4.39* 3.45* 1.46* 0.98  CALCIUM 8.5* 8.2* 8.0* 7.9* 7.5*   Liver Function Tests:  Recent Labs Lab 04/15/15 1106 04/16/15 0518  AST 22 22  ALT 10* 12*  ALKPHOS 66 61  BILITOT 0.5 0.6  PROT 6.2* 5.7*  ALBUMIN 3.1* 2.7*   No results for input(s): LIPASE, AMYLASE in the last 168 hours. No results for input(s): AMMONIA in the last 168 hours. CBC:  Recent Labs Lab 04/15/15 1106 04/16/15 0518 04/17/15 0517 04/17/15 2018 04/18/15 0439 04/19/15 0654  WBC 4.6 4.3 3.9* 4.2 3.8* 3.0*  NEUTROABS 2.2  --   --   --   --   --   HGB 8.3* 7.9* 7.4* 9.9* 9.3* 10.0*  HCT 27.3* 27.4* 24.6* 31.8* 29.6* 33.8*  MCV 71.8* 73.5* 72.4* 74.8* 74.2* 76.1*  PLT 198 192 173 167 181 188   Cardiac Enzymes: No results for input(s): CKTOTAL, CKMB, CKMBINDEX, TROPONINI in the last 168 hours. BNP (last 3 results) No results for input(s): BNP in the last 8760 hours.  ProBNP (last 3 results) No results for input(s): PROBNP in the last 8760 hours.  CBG:  Recent Labs Lab 04/18/15 1220 04/18/15 1618 04/18/15  2011 04/19/15 0729 04/19/15 1124  GLUCAP 177* 158* 152* 154* 156*    Recent Results (from the past 240 hour(s))  Blood Culture (routine x 2)     Status: None (Preliminary result)   Collection Time: 04/15/15 11:07 AM  Result Value Ref Range Status   Specimen Description BLOOD RIGHT HAND DRAWN BY RN  Final   Special Requests BOTTLES DRAWN AEROBIC ONLY 6CC  Final   Culture NO GROWTH 3 DAYS  Final   Report Status PENDING  Incomplete  Blood Culture (routine x 2)     Status: None (Preliminary result)   Collection Time: 04/15/15 11:08 AM  Result Value Ref Range Status   Specimen Description BLOOD RIGHT ANTECUBITAL DRAWN  BY RN  Final   Special Requests BOTTLES DRAWN AEROBIC ONLY 5CC  Final   Culture NO GROWTH 3 DAYS  Final   Report Status PENDING  Incomplete  Urine culture     Status: None   Collection Time: 04/15/15  1:08 PM  Result Value Ref Range Status   Specimen Description URINE, CLEAN CATCH  Final   Special Requests NONE  Final   Culture   Final    >=100,000 COLONIES/mL PROTEUS MIRABILIS Performed at [facility]    Report Status 04/18/2015 FINAL  Final   Organism ID, Bacteria PROTEUS MIRABILIS  Final      Susceptibility   Proteus mirabilis - MIC*    AMPICILLIN 8 SENSITIVE Sensitive     CEFAZOLIN 8 SENSITIVE Sensitive     CEFTRIAXONE <=1 SENSITIVE Sensitive     CIPROFLOXACIN >=4 RESISTANT Resistant     GENTAMICIN 8 INTERMEDIATE Intermediate     IMIPENEM 4 SENSITIVE Sensitive     NITROFURANTOIN 128 RESISTANT Resistant     TRIMETH/SULFA >=320 RESISTANT Resistant     AMPICILLIN/SULBACTAM 4 SENSITIVE Sensitive     PIP/TAZO <=4 SENSITIVE Sensitive     * >=100,000 COLONIES/mL PROTEUS MIRABILIS  MRSA PCR Screening     Status: None   Collection Time: 04/16/15  3:30 AM  Result Value Ref Range Status   MRSA by PCR NEGATIVE NEGATIVE Final    Comment:        The GeneXpert MRSA Assay (FDA approved for NASAL specimens only), is one component of a comprehensive MRSA colonization surveillance program. It is not intended to diagnose MRSA infection nor to guide or monitor treatment for MRSA infections.      Studies: No results found.  Scheduled Meds: . amLODipine  5 mg Oral Daily  . antiseptic oral rinse  7 mL Mouth Rinse BID  . ferrous sulfate  325 mg Oral Q breakfast  . insulin aspart  0-15 Units Subcutaneous TID WC  . insulin aspart  0-5 Units Subcutaneous QHS  . ondansetron (ZOFRAN) IV  4 mg Intravenous 4 times per day  . pantoprazole (PROTONIX) IV  40 mg Intravenous Daily  . risperiDONE  1 mg Oral QHS  . sodium chloride flush  3 mL Intravenous Q12H   Continuous Infusions:    Assessment and plan:  Principal Problem:   Acute pyelonephritis Active Problems:   Sepsis ([place])   Lower abdominal pain   Hypotension arterial   Nausea and vomiting   Distended bladder   Vaginal tumor   Gynecologic malignancy (HCC)   Hydroureteronephrosis   Lactic acidosis   Diabetes mellitus with complication (HCC)   Metabolic acidosis   Schizoaffective schizophrenia (HCC)   COPD (chronic obstructive pulmonary disease) (HCC)   Microcytic anemia   Cardiac pacemaker in  situ    Brief history of present illness: Patient is a 70 year old woman from [place]. She has a history of schizoaffective schizophrenia, dementia, diabetes, and pacemaker in situ. She presented on 04/15/2015 with nausea and vomiting. She was hypotensive and found to have a UTI/presumed polynephritis causing sepsis and lactic acidosis.. She also had acute kidney injury. Abdominal/pelvic CT ordered due to suprapubic pain and revealed distended bladder and bilateral hydronephrosis. Nephrology and urology were consulted. Urologist, Dr. Alyson Ingles inserted the Foley catheter and found a friable vaginal mass. GYN, Dr. Glo Herring was consulted. He found a large right vaginal wall mass extending down to the introitus, most likely a malignancy. He conferred with GYN-[facility] Dr. Denman George. The tentative plan is for a biopsy of the vaginal tumor by Dr. Glo Herring. Patient's renal function has returned to normal.     1. Urinary tract infection/possible acute polynephritis causing sepsis and lactic acidosis. Patient's urinalysis on admission was wholly infected appearing. She was hypotensive on arrival to the ED. She was febrile. Blood cultures and a urine culture ordered. She was bolused IV fluids and started on vancomycin and Zosyn. -Her blood pressure is better. -Blood cultures are negative to date. Urine culture revealed Proteus, sensitive to ceftriaxone. -We'll narrow antibiotics to ceftriaxone. Will discontinue Zosyn. Vancomycin  discontinued on 04/18/15.  Lower abdominal pain/distended abdomen/bilateral hydroureteronephrosis/? Bladder outlet obstruction CT scan of her abdomen and pelvis was ordered to evaluate for lower abdominal pain. Noncontrasted CT revealed prominently distended urinary bladder, mild bilateral hydroureteronephrosis and diffuse bladder wall thickening. Radiology suggested a differential diagnosis of acute infectious or inflammatory cystitis and bladder outlet obstruction. This was the etiology of her pain. -Renal ultrasound revealed echogenic filling defect in the urinary bladder and mild bilateral hydronephrosis. -Foley catheter insertion was attempted by nursing 2, but was unsuccessful.  -Urologist, Dr. Alyson Ingles was consulted. He inserted the Foley catheter on 04/16/15. He found on exam a anterior friable vaginal mass that was bleeding and invading the urethra. He recommended that the Foley remained intact and be chronic due to the tumor invading the urethra.  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  Per exam by Dr. Alyson Ingles, this was found. Pelvic ultrasound was ordered and revealed nonvisualization of the uterus and ovaries. GYN, Dr. Glo Herring was consulted. On exam, he palpated a large firm vaginal mass extended down to the introitus. He stated that this was most likely a vaginal or cervical origin tumor. He discussed the patient's case with GYN[facility] Dr. Denman [facility] at [facility]. He will work with Dr. Denman George over the weekend to arrange a plan which may involve consideration for radiation therapy once the diagnosis is fully established. -Dr. Glo Herring plans a biopsy.   Acute kidney injury. Patient's baseline creatinine is unknown, however, in 2013, her renal function was within normal limits. She was started on vigorous IV fluids. Fluids were tapered down and bicarbonate was added. Nephrology was consulted and recommended urology  consultation due to likely post renal azotemia. Urology was consulted and inserted a Foley catheter. The catheter promptly drained 800 cc of cloudy dark urine.  -Etiology of her acute kidney injury was multifactorial including sepsis, ATN, and postobstructive. -The patient's creatinine progressively improved and normalized. -IV fluids tapered down. -We'll need to watch for post obstructive diuresis.  Metabolic acidosis. Her CO2 was 19, but has fallen to 15. The acidosis was secondary to AKI or sepsis or from metformin. Her lactic acidosis was elevated on admission, but  did normalize. Metformin was held. -Bicarbonate was added to the IV fluids. Her CO2 has improved and normalized.  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 Patient is treated with ferrous sulfate chronically. Her hemoglobin was 8.3 on admission. Her MCV was 72. With hydration, her hemoglobin has fallen to 7.4. Urology noted a friable vaginal mass that was bleeding some. Anemia panel revealed a total iron of 5, TIBC of 269, vitamin B12 of 187, folate of 10.8. Her TSH was within normal limits.  Ferrous sulfate started once daily.  -Packed red blood cells transfusion was ordered and approved by her guardian. -Patient's hemoglobin improved appropriately.   Borderline vitamin B12 deficiency. Patient's vitamin B12 level was 187. She was started on vitamin B12 injections 3 days.  Essential hypertension. The patient was hypotensive on admission. She is treated chronically with lisinopril and amlodipine; both were held on admission. With improvement in her blood pressure, amlodipine was started. Will consider restarting lisinopril now that her renal function has returned to normal.   Type 2 diabetes mellitus. Patient is treated chronically with metformin. It is being held due to acidosis. She was started on sliding scale NovoLog. Her CBGs are trending up.  Her hemoglobin A1c is 8.0. -Sliding scale NovoLog  was increased to moderate scale and added  bedtime NovoLog. CBGs have improved.   Schizoaffective schizophrenia and dementia. Currently stable. on 04/18/15 ration stated "I don't have a tumor", it's "plaque". We'll continue Risperdal.   Time spent: 25  minutes    [facility] Pager [phone]. If 7PM-7AM, please contact night-coverage at [email or web address], password [facility] 04/19/2015, 2:22 PM  LOS: 4 days

## 2015-04-20 DIAGNOSIS — C52 Malignant neoplasm of vagina: Secondary | ICD-10-CM

## 2015-04-20 LAB — CBC
HEMATOCRIT: 33 % — AB (ref 36.0–46.0)
HEMOGLOBIN: 9.7 g/dL — AB (ref 12.0–15.0)
MCH: 22.8 pg — AB (ref 26.0–34.0)
MCHC: 29.4 g/dL — ABNORMAL LOW (ref 30.0–36.0)
MCV: 77.6 fL — AB (ref 78.0–100.0)
PLATELETS: 195 10*3/uL (ref 150–400)
RBC: 4.25 MIL/uL (ref 3.87–5.11)
RDW: 18.5 % — ABNORMAL HIGH (ref 11.5–15.5)
WBC: 3.6 10*3/uL — ABNORMAL LOW (ref 4.0–10.5)

## 2015-04-20 LAB — GLUCOSE, CAPILLARY
GLUCOSE-CAPILLARY: 180 mg/dL — AB (ref 65–99)
GLUCOSE-CAPILLARY: 121 mg/dL — AB (ref 65–99)
GLUCOSE-CAPILLARY: 132 mg/dL — AB (ref 65–99)
Glucose-Capillary: 128 mg/dL — ABNORMAL HIGH (ref 65–99)

## 2015-04-20 LAB — CULTURE, BLOOD (ROUTINE X 2)
Culture: NO GROWTH
Culture: NO GROWTH

## 2015-04-20 LAB — BASIC METABOLIC PANEL
Anion gap: 8 (ref 5–15)
BUN: 9 mg/dL (ref 6–20)
CHLORIDE: 103 mmol/L (ref 101–111)
CO2: 30 mmol/L (ref 22–32)
Calcium: 7.3 mg/dL — ABNORMAL LOW (ref 8.9–10.3)
Creatinine, Ser: 0.77 mg/dL (ref 0.44–1.00)
GFR calc Af Amer: >60 mL/min (ref >60)
GFR calc non Af Amer: >60 mL/min (ref >60)
GLUCOSE: 145 mg/dL — AB (ref 65–99)
POTASSIUM: 3.4 mmol/L — AB (ref 3.5–5.1)
Sodium: 141 mmol/L (ref 135–145)

## 2015-04-20 MED ORDER — POTASSIUM CHLORIDE CRYS ER 20 MEQ PO TBCR
40.0000 meq | EXTENDED_RELEASE_TABLET | Freq: Once | ORAL | Status: AC
  Administered 2015-04-20 (×2): 40 meq via ORAL
  Filled 2015-04-20: qty 2

## 2015-04-20 MED ORDER — PANTOPRAZOLE SODIUM 40 MG PO TBEC
40.0000 mg | DELAYED_RELEASE_TABLET | Freq: Every day | ORAL | Status: DC
  Administered 2015-04-21: 40 mg via ORAL
  Filled 2015-04-20: qty 1

## 2015-04-20 NOTE — Procedures (Signed)
Vaginal BIOPSY NOTE The indications for vulvar biopsy (rule out neoplasia, ) were reviewed.   Risks of the biopsy including pain, bleeding, infection, inadequate specimen, scarring and need for additional procedures  were discussed. The patient  agreed to undergo procedure today. Consent was previously obtained by myself and by RN and documented in the progress notes,  time out performed.  The patient's vaginal lesion was accessed by use of Pedersen speculum, in the bed with assistance by RN x 2 and MD x 2.. A 3-mm punch biopsy was done at nine oclock position, midway up vagina where an eroded protruding portion of the mass is noted and accessible, biopsy tissue was picked up and placed in formalin.  Small bleeding was noted and hemostasis was achieved using monsels solution.  The patient tolerated the procedure well. Post-procedure instructions  (pelvic rest for one week) were given to the patient. The patient is to call with heavy bleeding, fever greater than 100.4, foul smelling vaginal discharge or other concerns. The patient will be monitored x 1 hour, and then can be considered for outpt followup. Results to be obtained and discussed with [facility].

## 2015-04-20 NOTE — Progress Notes (Signed)
[facility] PROGRESS NOTE  Olwen Rugg [id number] DOB: 1945-07-03 DOA: 04/15/2015 PCP: Jani Gravel, MD    Code Status: Full code Family Communication: Discussion with guardian, Timoteo Ace on 04/19/15. Disposition Plan: Discharge back to ALF when clinically appropriate, likely in the next 24 hours. Will order PT evaluation.   Consultants:  Gynecology  Urology,   Nephrology  Procedures:  None  Antibiotics:  Rocephin 3/5>>  Vancomycin 04/15/15>> 04/18/15.  Zosyn 04/15/15>>04/19/15  HPI/Subjective: (Patient seen earlier, prior to the vaginal mass biopsy by Dr. Glo Herring).  Patient denies chest pain, shortness of breath, or abdominal pain.   Objective: Filed Vitals:   04/20/15 0608 04/20/15 1006  BP: 113/57 127/68  Pulse: 62   Temp: 99.1 F (37.3 C)   Resp: 20    oxygen saturation 95%  on room air.  Intake/Output Summary (Last 24 hours) at 04/20/15 1336 Last data filed at 04/20/15 0609  Gross per 24 hour  Intake    243 ml  Output   1650 ml  Net  -1407 ml   Filed Weights   04/17/15 0500 04/18/15 0500 04/19/15 0635  Weight: 87 kg (191 lb 12.8 oz) 86.9 kg (191 lb 9.3 oz) 85.095 kg (187 lb 9.6 oz)    Exam:   General:  Alert [name] in no acute distress.  Cardiovascular: S1, S2, with a soft systolic murmur.  Respiratory: Clear anteriorly with decreased breath sounds in the bases.  GU: Per Dr. Alyson Ingles urologist's exam on 04/16/15, there was a palpable friable vagina mass. Exam per GYN Dr. Glo Herring on 04/17/15 showed an entire right vaginal wall involvement with a firm irregular fixed mass that extends down to the vaginal origin.  Abdomen: Positive bowel sounds, soft, nontender, nondistended.   positive bowel sounds. GU: Pink-colored urine in the Foley bag.  Musculoskeletal/extremities: no pedal edema.   Neurologic: She is alert and oriented to herself, but not to the hospital. On 04/18/15, she refused to acknowledge being told  that she has a vaginal tumor. She called it "plaque".    Data Reviewed: Basic Metabolic Panel:  Recent Labs Lab 04/16/15 0518 04/17/15 0517 04/18/15 0439 04/19/15 0654 04/20/15 0656  NA 139 141 139 141 141  K 4.9 4.1 3.5 3.6 3.4*  CL 110 109 106 103 103  CO2 15* 21* 25 30 30   GLUCOSE 188* 261* 175* 179* 145*  BUN 87* 76* 35* 16 9  CREATININE 4.39* 3.45* 1.46* 0.98 0.77  CALCIUM 8.2* 8.0* 7.9* 7.5* 7.3*   Liver Function Tests:  Recent Labs Lab 04/15/15 1106 04/16/15 0518  AST 22 22  ALT 10* 12*  ALKPHOS 66 61  BILITOT 0.5 0.6  PROT 6.2* 5.7*  ALBUMIN 3.1* 2.7*   No results for input(s): LIPASE, AMYLASE in the last 168 hours. No results for input(s): AMMONIA in the last 168 hours. CBC:  Recent Labs Lab 04/15/15 1106  04/17/15 0517 04/17/15 2018 04/18/15 0439 04/19/15 0654 04/20/15 0656  WBC 4.6  < > 3.9* 4.2 3.8* 3.0* 3.6*  NEUTROABS 2.2  --   --   --   --   --   --   HGB 8.3*  < > 7.4* 9.9* 9.3* 10.0* 9.7*  HCT 27.3*  < > 24.6* 31.8* 29.6* 33.8* 33.0*  MCV 71.8*  < > 72.4* 74.8* 74.2* 76.1* 77.6*  PLT 198  < > 173 167 181 188 195  < > = values in this interval not displayed. Cardiac Enzymes: No results for input(s): CKTOTAL, CKMB, CKMBINDEX,  TROPONINI in the last 168 hours. BNP (last 3 results) No results for input(s): BNP in the last 8760 hours.  ProBNP (last 3 results) No results for input(s): PROBNP in the last 8760 hours.  CBG:  Recent Labs Lab 04/19/15 1124 04/19/15 1647 04/19/15 2052 04/20/15 0813 04/20/15 1141  GLUCAP 156* 166* 154* 128* 180*    Recent Results (from the past 240 hour(s))  Blood Culture (routine x 2)     Status: None   Collection Time: 04/15/15 11:07 AM  Result Value Ref Range Status   Specimen Description BLOOD RIGHT HAND DRAWN BY RN  Final   Special Requests BOTTLES DRAWN AEROBIC ONLY 6CC  Final   Culture NO GROWTH 5 DAYS  Final   Report Status 04/20/2015 FINAL  Final  Blood Culture (routine x 2)     Status: None    Collection Time: 04/15/15 11:08 AM  Result Value Ref Range Status   Specimen Description BLOOD RIGHT ANTECUBITAL DRAWN BY RN  Final   Special Requests BOTTLES DRAWN AEROBIC ONLY 5CC  Final   Culture NO GROWTH 5 DAYS  Final   Report Status 04/20/2015 FINAL  Final  Urine culture     Status: None   Collection Time: 04/15/15  1:08 PM  Result Value Ref Range Status   Specimen Description URINE, CLEAN CATCH  Final   Special Requests NONE  Final   Culture   Final    >=100,000 COLONIES/mL PROTEUS MIRABILIS Performed at [facility]    Report Status 04/18/2015 FINAL  Final   Organism ID, Bacteria PROTEUS MIRABILIS  Final      Susceptibility   Proteus mirabilis - MIC*    AMPICILLIN 8 SENSITIVE Sensitive     CEFAZOLIN 8 SENSITIVE Sensitive     CEFTRIAXONE <=1 SENSITIVE Sensitive     CIPROFLOXACIN >=4 RESISTANT Resistant     GENTAMICIN 8 INTERMEDIATE Intermediate     IMIPENEM 4 SENSITIVE Sensitive     NITROFURANTOIN 128 RESISTANT Resistant     TRIMETH/SULFA >=320 RESISTANT Resistant     AMPICILLIN/SULBACTAM 4 SENSITIVE Sensitive     PIP/TAZO <=4 SENSITIVE Sensitive     * >=100,000 COLONIES/mL PROTEUS MIRABILIS  MRSA PCR Screening     Status: None   Collection Time: 04/16/15  3:30 AM  Result Value Ref Range Status   MRSA by PCR NEGATIVE NEGATIVE Final    Comment:        The GeneXpert MRSA Assay (FDA approved for NASAL specimens only), is one component of a comprehensive MRSA colonization surveillance program. It is not intended to diagnose MRSA infection nor to guide or monitor treatment for MRSA infections.      Studies: No results found.  Scheduled Meds: . amLODipine  5 mg Oral Daily  . antiseptic oral rinse  7 mL Mouth Rinse BID  . ferrous sulfate  325 mg Oral Q breakfast  . insulin aspart  0-15 Units Subcutaneous TID WC  . insulin aspart  0-5 Units Subcutaneous QHS  . ondansetron (ZOFRAN) IV  4 mg Intravenous 4 times per day  . pantoprazole (PROTONIX) IV  40  mg Intravenous Daily  . risperiDONE  1 mg Oral QHS  . sodium chloride flush  3 mL Intravenous Q12H   Continuous Infusions:   Assessment and plan:  Principal Problem:   Acute pyelonephritis Active Problems:   Sepsis ([place])   Lower abdominal pain   Hypotension arterial   Nausea and vomiting   Distended bladder   Vaginal tumor  Gynecologic malignancy (HCC)   Hydroureteronephrosis   Lactic acidosis   Diabetes mellitus with complication (HCC)   Metabolic acidosis   Schizoaffective schizophrenia (HCC)   COPD (chronic obstructive pulmonary disease) (HCC)   Microcytic anemia   Cardiac pacemaker in situ    Brief history of present illness: Patient is a 70 year old woman from [place]. She has a history of schizoaffective schizophrenia, dementia, diabetes, and pacemaker in situ. She presented on 04/15/2015 with nausea and vomiting. She was hypotensive and found to have a UTI/presumed polynephritis causing sepsis and lactic acidosis.. She also had acute kidney injury. Abdominal/pelvic CT ordered due to suprapubic pain and revealed distended bladder and bilateral hydronephrosis. Nephrology and urology were consulted. Urologist, Dr. Alyson Ingles inserted the Foley catheter and found a friable vaginal mass. GYN, Dr. Glo Herring was consulted. He found a large right vaginal wall mass extending down to the introitus, most likely a malignancy. He conferred with GYN-[facility] Dr. Denman George. The tentative plan is for a biopsy of the vaginal tumor by Dr. Glo Herring. Patient's renal function has returned to normal.     1. Urinary tract infection/possible acute polynephritis causing sepsis and lactic acidosis. Patient's urinalysis on admission was wholly infected appearing. She was hypotensive on arrival to the ED. She was febrile. Blood cultures and a urine culture ordered. She was bolused IV fluids and started on vancomycin and Zosyn. -Her blood pressure is better. -Blood cultures are negative to date. Urine  culture revealed Proteus, sensitive to ceftriaxone. -Zosyn was discontinued on 04/19/15.  Vancomycin discontinued on 04/18/15. -Patient started on ceftriaxone 04/19/15. -Continue current management.  Lower abdominal pain/distended abdomen/bilateral hydroureteronephrosis/? Bladder outlet obstruction CT scan of her abdomen and pelvis was ordered to evaluate for lower abdominal pain. Noncontrasted CT revealed prominently distended urinary bladder, mild bilateral hydroureteronephrosis and diffuse bladder wall thickening. Radiology suggested a differential diagnosis of acute infectious or inflammatory cystitis and bladder outlet obstruction. This was the etiology of her pain. -Renal ultrasound revealed echogenic filling defect in the urinary bladder and mild bilateral hydronephrosis. -Foley catheter insertion was attempted by nursing 2, but was unsuccessful.  -Urologist, Dr. Alyson Ingles was consulted. He inserted the Foley catheter on 04/16/15. He found on exam a anterior friable vaginal mass that was bleeding and invading the urethra. He recommended that the Foley remained intact and be chronic due to the tumor invading the urethra.  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  Per exam by Dr. Alyson Ingles, this was found. Pelvic ultrasound was ordered and revealed nonvisualization of the uterus and ovaries. GYN, Dr. Glo Herring was consulted. On exam, he palpated a large firm vaginal mass extended down to the introitus. He stated that this was most likely a vaginal or cervical origin tumor. He discussed the patient's case with GYN[facility] Dr. Denman George at [facility]. Vaginal mass biopsy was recommended for pathology to guide potential treatment. r -Dr. Glo Herring performed a bedside vaginal mass biopsy on 04/20/15. Pathology results are pending.  -Coordination with GYN-[facility] and probable radiation treatment will be done as an outpatient.    Acute kidney  injury. Patient's baseline creatinine is unknown, however, in 2013, her renal function was within normal limits. She was started on vigorous IV fluids. Fluids were tapered down and bicarbonate was added. Nephrology was consulted and recommended urology consultation due to likely post renal azotemia. Urology was consulted and inserted a Foley catheter. The catheter promptly drained 800 cc of cloudy dark urine.  -Etiology of her acute  kidney injury was multifactorial including sepsis, ATN, and postobstructive. -The patient's creatinine progressively improved and normalized. IV fluids were discontinued.  -We'll need to watch for post obstructive diuresis.  Metabolic acidosis. Her CO2 was 19, but has fallen to 15. The acidosis was secondary to AKI or sepsis or from metformin. Her lactic acidosis was elevated on admission, but did normalize. Metformin was held. -Bicarbonate was added to the IV fluids. Her CO2 has improved and normalized.  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 Patient is treated with ferrous sulfate chronically. Her hemoglobin was 8.3 on admission. Her MCV was 72. With hydration, her hemoglobin has fallen to 7.4. Urology noted a friable vaginal mass that was bleeding some. Anemia panel revealed a total iron of 5, TIBC of 269, vitamin B12 of 187, folate of 10.8. Her TSH was within normal limits.  Ferrous sulfate started once daily.  -Packed red blood cells transfusion was ordered and approved by her guardian. -Patient's hemoglobin improved appropriately.   Borderline vitamin B12 deficiency. Patient's vitamin B12 level was 187. She was started on vitamin B12 injections 3 days.  Essential hypertension. The patient was hypotensive on admission. She is treated chronically with lisinopril and amlodipine; both were held on admission. With improvement in her blood pressure, amlodipine was started. Will consider restarting lisinopril now that her renal  function has returned to normal.   Type 2 diabetes mellitus. Patient is treated chronically with metformin. It is being held due to acidosis. She was started on sliding scale NovoLog. Her CBGs are trending up.  Her hemoglobin A1c is 8.0. -Sliding scale NovoLog was increased to moderate scale and added  bedtime NovoLog. CBGs have improved.   Schizoaffective schizophrenia and dementia. Currently stable. on 04/18/15 ration stated "I don't have a tumor", it's "plaque". We'll continue Risperdal.   Time spent: 25  minutes    [facility] Pager [phone]. If 7PM-7AM, please contact night-coverage at [email or web address], password [facility] 04/20/2015, 1:36 PM  LOS: 5 days

## 2015-04-20 NOTE — Progress Notes (Addendum)
Subjective: I have spoken to Felicia Grant appointed Legal guardian and after discussion , informing her of rationale of biopsy to guide subsequent treatment, probably radiation treatment, CONSENT is obtained for vaginal biopsy.   Patient reports that she refuses to consider allowing biopsy, but after further dialogue with her, she cautiously agrees to let me attempt biopsy at lunch.    Objective: I have reviewed patient's vital signs. The patient would be a candidate to return to nursing home, once our efforts here are completed.  General: alert, cooperative, appears stated age and no distress GI: soft, non-tender; bowel sounds normal; no masses,  no organomegaly Vaginal Bleeding: minimal   Assessment/Plan: Vaginal mass, probable cancer, for biopsy at lunch. Logistics of getting equipment for biopsy to be done this a.m. Hospital no longer has gyn exam tables in ED.   LOS: 5 days    biopsy today, then d/c  Back to ALF.       Coordination with gyn onc and probable radation tx to be done as outpt.   Constantino Starace V 04/20/2015, 8:15 AM

## 2015-04-20 NOTE — Progress Notes (Signed)
Consent received to transfuse blood from [facility].

## 2015-04-21 ENCOUNTER — Encounter (HOSPITAL_COMMUNITY): Payer: Self-pay | Admitting: Internal Medicine

## 2015-04-21 LAB — BASIC METABOLIC PANEL
Anion gap: 8 (ref 5–15)
BUN: 6 mg/dL (ref 6–20)
CALCIUM: 7.7 mg/dL — AB (ref 8.9–10.3)
CHLORIDE: 103 mmol/L (ref 101–111)
CO2: 30 mmol/L (ref 22–32)
CREATININE: 0.73 mg/dL (ref 0.44–1.00)
GFR calc Af Amer: >60 mL/min (ref >60)
GFR calc non Af Amer: >60 mL/min (ref >60)
GLUCOSE: 138 mg/dL — AB (ref 65–99)
Potassium: 4 mmol/L (ref 3.5–5.1)
Sodium: 141 mmol/L (ref 135–145)

## 2015-04-21 LAB — GLUCOSE, CAPILLARY
Glucose-Capillary: 131 mg/dL — ABNORMAL HIGH (ref 65–99)
Glucose-Capillary: 167 mg/dL — ABNORMAL HIGH (ref 65–99)

## 2015-04-21 MED ORDER — CEFUROXIME AXETIL 500 MG PO TABS: 500.0000 mg | ORAL_TABLET | Freq: Two times a day (BID) | ORAL | Status: DC

## 2015-04-21 MED ORDER — DEXTROSE 5 % IV SOLN
1.0000 g | INTRAVENOUS | Status: DC
  Administered 2015-04-21: 1 g via INTRAVENOUS
  Filled 2015-04-21 (×2): qty 10

## 2015-04-21 NOTE — Discharge Summary (Signed)
Physician Discharge Summary  Felicia Grant [id number] DOB: Dec 01, 1945 DOA: 04/15/2015  PCP: Jani Gravel, MD  Admit date: 04/15/2015 Discharge date: 04/21/2015  Time spent: Greater than 30 minutes  Recommendations for Outpatient Follow-up:  1. Foley catheter should stay in until further notice. Provide Foley care at the nursing facility. 2. Dr. Johnnye Sima office will call the patient or guardian regarding follow-up of the vaginal tumor. Please call his office if you do not hear from his office in one week or less. 3. Recommend follow-up CBC to check her hemoglobin in one week or less. 4. Patient is being discharged to [facility].     Discharge Diagnoses:  1. Proteus UTI with acute pyelonephritis. 2. Sepsis and lactic acidosis secondary to Proteus UTI. 3. 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 4. Acute kidney injury secondary to ATN and postobstructive renal failure. 5. 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 6. Acute blood loss anemia secondary to vaginal bleeding. Status post 2 units of packed red blood cell transfusion. 7. Type 2 diabetes mellitus. 8. COPD, remained stable. 9. History of cardiac pacemaker in situ. 10. Dementia. 11. Schizoaffective schizophrenia. 12. Essential hypertension. 13. Borderline vitamin B12 deficiency.    Discharge Condition: Improved  Diet recommendation: Heart healthy with minimal sugar in diet.  Filed Weights   04/18/15 0500 04/19/15 0635 04/21/15 0638  Weight: 86.9 kg (191 lb 9.3 oz) 85.095 kg (187 lb 9.6 oz) 85.004 kg (187 lb 6.4 oz)    History of present illness:  Patient is a 70 year old woman with a history of dementia, schizoaffective schizophrenia, COPD, and diabetes mellitus, who presented to the ED from [facility] assisted living facility with a complaint of nausea and vomiting. In the ED, on arrival, her blood pressure was  76/51. With a bolus of IV fluids, it improved to 126/55. She was oxygenating 93-100% on room air. She was febrile with a temperature 100.6. Her urinalysis is wholly infected appearing. Her lab data are significant for hemoglobin of 8.3, normal WBC, sodium of 134, CO2 of 19, BUN of 93, creatinine of 4.95, and glucose of 208. Her lactic acid was 2.41 on admission, but improved to 1.70. Her chest x-ray revealed low lung volumes and central venous congestion, but no acute findings. She was admitted for further evaluation and management.    Hospital Course:   1. Urinary tract infection/possible acute polynephritis causing sepsis and lactic acidosis. Patient's urinalysis on admission was wholly infected appearing. She was hypotensive on arrival to the ED. She was febrile. Blood cultures and a urine culture ordered. She was bolused IV fluids and started on vancomycin and Zosyn. -Her blood pressure is better. -Blood cultures are negative to date. Urine culture revealed Proteus, sensitive to ceftriaxone. -Zosyn was discontinued on 04/19/15. Vancomycin discontinued on 04/18/15. -Patient started on ceftriaxone on 04/21/15. She was discharged on 3 more days of treatment with Ceftin for total of 7 days.  Lower abdominal pain/distended abdomen/bilateral hydroureteronephrosis/? Bladder outlet obstruction CT scan of her abdomen and pelvis was ordered to evaluate for lower abdominal pain. Noncontrasted CT revealed prominently distended urinary bladder, mild bilateral hydroureteronephrosis and diffuse bladder wall thickening. Radiology suggested a differential diagnosis of acute infectious or inflammatory cystitis and bladder outlet obstruction. This was the etiology of her pain. -Renal ultrasound revealed echogenic filling defect in the urinary bladder and mild bilateral hydronephrosis. -Foley catheter insertion was attempted by nursing 2, but was unsuccessful.  -Urologist, Dr. Alyson Ingles was consulted.  He inserted the  Foley catheter on 04/16/15. He found on exam a anterior friable vaginal mass that was bleeding and invading the urethra. He recommended that the Foley remained intact  due to the tumor invading the urethra. Therefore, patient was discharged with the indwelling Foley catheter. It can be discontinued following the surgical intervention by gynecology-oncology when appropriate.  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  Per exam by Dr. Alyson Ingles, the vaginal mass found. Pelvic ultrasound was ordered and revealed nonvisualization of the uterus and ovaries. GYN, Dr. Glo Herring was consulted. On exam, he palpated a large firm vaginal mass extended down to the introitus. He stated that this was most likely a vaginal or cervical origin tumor. He discussed the patient's case with GYN-ONC Dr. Denman George at [facility]. Vaginal mass biopsy was recommended for pathology to guide potential treatment.  -Dr. Glo Herring performed a bedside vaginal mass biopsy on 04/20/15. Pathology results are pending.  -Coordination with GYN-ONC for possible chemotherapy and/or radiation treatment will be done as an outpatient.   Acute kidney injury. Patient's baseline creatinine is unknown, however, in 2013, her renal function was within normal limits. She was started on vigorous IV fluids. Fluids were tapered down and bicarbonate was added. Nephrology was consulted and recommended urology consultation due to likely post renal azotemia. Urology was consulted and inserted a Foley catheter. The catheter promptly drained 800 cc of cloudy dark urine.  -Etiology of her acute kidney injury was multifactorial including sepsis, ATN, and postobstructive. -The patient's creatinine progressively improved and normalized. IV fluids were discontinued.   Metabolic acidosis. Her CO2 was 19, but had fallen to 15. The acidosis was secondary to AKI or sepsis or less likely  from  metformin. Her lactic acidosis was elevated on admission, but did normalize. Metformin was held. -Bicarbonate was added to the IV fluids. Her CO2 has improved and normalized.  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 Patient is treated with ferrous sulfate chronically. Her hemoglobin was 8.3 on admission. Her MCV was 72. With hydration, her hemoglobin has fallen to 7.4. Urology noted a friable vaginal mass that was bleeding some. Anemia panel revealed a total iron of 5, TIBC of 269, vitamin B12 of 187, folate of 10.8. Her TSH was within normal limits. Ferrous sulfate started once daily.  -Packed red blood cells transfusion was ordered and approved by her guardian; she was transfused 2 units prbc's -Patient's hemoglobin improved appropriately.   Borderline vitamin B12 deficiency. Patient's vitamin B12 level was 187. She was started on vitamin B12 injections 3 days.  recommend rechecking the patient's vitamin B12 level in 6 months.  Essential hypertension. The patient was hypotensive on admission. She is treated chronically with lisinopril and amlodipine; both were held on admission. With improvement in her blood pressure, amlodipine was started. given that her renal function had normalized, the senna pill was restarted at the time of discharge.    Type 2 diabetes mellitus. Patient is treated chronically with metformin. It is being held due to acidosis. She was started on sliding scale NovoLog. Her Her hemoglobin A1c is 8.0.with resolution of her acidosis and sepsis, metformin was restarted at the time of discharge.   Schizoaffective schizophrenia and dementia. Remained stable on Risperdal.      Procedures:  04/20/15: Vaginal mass biopsy by Dr. Glo Herring.  Consultations:  Gynecology  Urology  Nephrology  Discharge Exam: Filed Vitals:   04/20/15 2056 04/21/15 [id number]  BP:  133/63 135/62  Pulse: 62 60  Temp: 98.7 F (37.1 C) 98.5 F (36.9 C)   Resp: 20 20    General: Alert [name] in no acute distress. Cardiovascular: S1, S2, with a soft systolic murmur. Respiratory: Decreased breath sounds in bases and clear anteriorly. GU: Blood-tinged urine in the Foley bag.  Discharge Instructions   Discharge Instructions    Diet - low sodium heart healthy    Complete by:  As directed      Diet Carb Modified    Complete by:  As directed      Discharge instructions    Complete by:  As directed   Patient's Foley catheter should stay in until further notice.     Increase activity slowly    Complete by:  As directed           Current Discharge Medication List    START taking these medications   Details  cefUROXime (CEFTIN) 500 MG tablet Take 1 tablet (500 mg total) by mouth 2 (two) times daily with a meal. Antibiotic to be taken for 3 more days. Qty: 6 tablet, Refills: 0      CONTINUE these medications which have NOT CHANGED   Details  acetaminophen (TYLENOL) 325 MG tablet Take 650 mg by mouth every 6 (six) hours as needed for mild pain or moderate pain.    amLODipine (NORVASC) 5 MG tablet Take 5 mg by mouth daily.    cetirizine (ZYRTEC) 10 MG tablet Take 10 mg by mouth daily.    cholecalciferol (VITAMIN D) 400 units TABS tablet Take 400 Units by mouth daily.    docusate sodium (COLACE) 100 MG capsule Take 100 mg by mouth 2 (two) times daily as needed for mild constipation.    ferrous sulfate 325 (65 FE) MG tablet Take 325 mg by mouth at bedtime.    lisinopril (PRINIVIL,ZESTRIL) 40 MG tablet Take 40 mg by mouth daily.    metFORMIN (GLUCOPHAGE) 500 MG tablet Take 500-1,000 mg by mouth 2 (two) times daily with a meal. 2 tablets every morning and 1 tablet every evening    nystatin cream (MYCOSTATIN) Apply 1 application topically 2 (two) times daily.    risperiDONE (RISPERDAL) 1 MG tablet Take 1 mg by mouth at bedtime.    senna-docusate (TGT SENNA LAX/STOOL SOFTENER) 8.6-50 MG tablet Take 1  tablet by mouth daily.    simvastatin (ZOCOR) 40 MG tablet Take 40 mg by mouth at bedtime.      STOP taking these medications     aspirin EC 81 MG tablet      tolterodine (DETROL LA) 4 MG 24 hr capsule        No Known Allergies Follow-up Information    Follow up with Jonnie Kind, MD.   Specialties:  Obstetrics and Gynecology, Radiology   Why:  Dr. Johnnye Sima office will call for a follow-up. Call his office if you do not hear from them in 1 week.   Contact information:   [place] [address] [phone]        The results of significant diagnostics from this hospitalization (including imaging, microbiology, ancillary and laboratory) are listed below for reference.    Significant Diagnostic Studies: Ct Abdomen Pelvis Wo Contrast  04/15/2015  CLINICAL DATA:  70 year old female inpatient with lower abdominal pain, nausea and vomiting. Dysuria. EXAM: CT ABDOMEN AND PELVIS WITHOUT CONTRAST TECHNIQUE: Multidetector CT imaging of the abdomen and pelvis was performed following the standard protocol without  IV contrast. COMPARISON:  None. FINDINGS: Lower chest: No significant pulmonary nodules or acute consolidative airspace disease. There is a bulla/pneumatocele at the right lower lobe base. Pacer leads are seen terminating in the right atrium and right ventricular apex. Trace pericardial fluid/thickening. Coronary atherosclerosis. Nonspecific calcifications are present throughout both breasts. Hepatobiliary: Normal liver with no liver mass. Normal gallbladder with no radiopaque cholelithiasis. No biliary ductal dilatation. Pancreas: There is complete fatty replacement of the pancreas, with no pancreatic mass or appreciable pancreatic ductal dilatation. Spleen: Normal size. No mass. Adrenals/Urinary Tract: Mild diffuse thickening of both adrenal glands, without discrete adrenal nodule, suggesting mild adrenal hyperplasia. There is symmetric mild bilateral  hydroureteronephrosis. No renal or ureteral stones. No contour deforming renal masses. A prominently distended urinary bladder with diffuse bladder wall thickening and prominent fat stranding surrounding the entire bladder wall. Stomach/Bowel: Grossly normal stomach. Normal caliber small bowel with no small bowel wall thickening. Appendix is not discretely visualized. Normal large bowel with no diverticulosis, large bowel wall thickening or pericolonic fat stranding. Vascular/Lymphatic: Atherosclerotic nonaneurysmal abdominal aorta. No pathologically enlarged lymph nodes in the abdomen or pelvis. Reproductive: No adnexal mass. The region of the uterus is poorly visualized on this noncontrast study. Uterus could be atrophic or surgically absent. Other: No pneumoperitoneum, ascites or focal fluid collection. Musculoskeletal: No aggressive appearing focal osseous lesions. 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 Moderate degenerative changes in the visualized thoracolumbar spine. IMPRESSION: 1. Prominently distended urinary bladder. Mild bilateral hydroureteronephrosis. Diffuse bladder wall thickening and prominent perivesical fat stranding. Findings suggest acute infectious or inflammatory cystitis and bladder outlet obstruction. Urgent Foley catheter placement advised. 2. Additional findings include coronary atherosclerosis, mild adrenal hyperplasia and severe right hip osteoarthritis. These results were called by telephone at the time of interpretation on 04/15/2015 at 9:45 pm to Dr. Dayna Barker, who verbally acknowledged these results. Electronically Signed   By: Ilona Sorrel M.D.   On: 04/15/2015 21:49   Dg Chest 2 View  04/15/2015  CLINICAL DATA:  Pt sent from assisted living for of vomiting. hypotensive and is currently being tx for UTI. EXAM: CHEST  2 VIEW COMPARISON:  None. FINDINGS: LEFT-sided pacemaker overlies normal cardiac silhouette. Low lung volumes. Central venous congestion. No focal  infiltrate or pneumothorax. IMPRESSION: Low lung volumes and central venous congestion.  No acute findings. Electronically Signed   By: Suzy Bouchard M.D.   On: 04/15/2015 14:40   Dg Lumbar Spine Complete  04/05/2015  CLINICAL DATA:  Fall.  Back pain. EXAM: LUMBAR SPINE - COMPLETE 4+ VIEW COMPARISON:  None. FINDINGS: No fracture. Minimal anterolisthesis of L4 on L5 with a mild retrolisthesis of L5 on S1. Mild loss disc height at L4-L5 with moderate loss of disc height at L5-S1. There are minor endplate osteophytes along the mid to lower lumbar spine. Bones are diffusely demineralized. Calcifications seen along a normal caliber abdominal aorta. There is significant increased stool noted throughout the visualized colon. IMPRESSION: 1. No fracture or acute finding. 2. Degenerative changes as described with diffuse bone demineralization. 3. Significant increased stool noted throughout the visualized colon. Electronically Signed   By: Lajean Manes M.D.   On: 04/05/2015 11:26   Ct Head Wo Contrast  04/05/2015  CLINICAL DATA:  Pt comes in from [facility]. Pt states she was walking down the hall and her "legs gave out." Pt denies hitting her head or any dizziness when she fell. Hx of diabetes,HTN,dementia, Parkinson".[place] EXAM: CT HEAD WITHOUT CONTRAST TECHNIQUE: Contiguous axial  images were obtained from the base of the skull through the vertex without intravenous contrast. COMPARISON:  None. FINDINGS: There is mild periventricular white matter change consistent with small vessel disease. Small lacunar infarct identified within the left globus pallidus is remote. There is no intra or extra-axial fluid collection or mass lesion. The basilar cisterns and ventricles have a normal appearance. There is no CT evidence for acute infarction or hemorrhage. There is mild mucoperiosteal thickening within the paranasal sinuses. No air-fluid levels are identified to indicate acute sinusitis. There is  atherosclerotic calcification of the internal carotid arteries. No acute calvarial injury. IMPRESSION: 1. Small vessel disease. 2. 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 3. Mild, chronic sinusitis. 4.  No evidence for acute intracranial abnormality. Electronically Signed   By: Nolon Nations M.D.   On: 04/05/2015 11:17   US Pelvis Complete  04/17/2015  CLINICAL DATA:  Vaginal mass with extension to urethra on physical exam EXAM: TRANSABDOMINAL ULTRASOUND OF PELVIS TECHNIQUE: Transabdominal ultrasound examination of the pelvis was performed including evaluation of the uterus, ovaries, adnexal regions, and pelvic cul-de-sac. COMPARISON:  None. FINDINGS: Uterus Not visualized. Right ovary Not discretely visualized. Left ovary Not discretely visualized. Other findings: Bladder is notable for an indwelling Foley catheter. IMPRESSION: Uterus and bilateral ovaries are not discretely visualized. Bladder is notable for an indwelling Foley catheter. Electronically Signed   By: Julian Hy M.D.   On: 04/17/2015 12:58   US Renal  04/16/2015  CLINICAL DATA:  Acute kidney injury. EXAM: RENAL / URINARY TRACT ULTRASOUND COMPLETE COMPARISON:  04/15/2015 FINDINGS: Right Kidney: Length: 12.6 cm. Mild hydronephrosis. Normal echogenicity of the renal parenchyma. No stones identified. No discrete renal mass seen. Left Kidney: Length: 12.1 cm. The kidney is very difficult to visualize due to location of bowel and body habitus as well as difficulty positioning the patient. Suspected mild left hydronephrosis. Bladder: There is abnormal echogenic filling defect in the urinary bladder. We did not demonstrate any Doppler flow within this multilobular filling defect which is irregular and accordingly difficult to measure. We did not see ureteral jets. IMPRESSION: 1. Abnormal filling defect in the urinary bladder without definite internal flow. This is irregular and probably blood products. 2. We did not see  ureteral jets of urine entering the urinary bladder to reassure for complete patency of the ureters. Also, there is bilateral hydronephrosis. 3. Very poor definition of the left kidney due to body habitus, bowel positioning, and difficulty positioning the patient. 4. Renal echogenicity normal on the right and probably normal on the left. Electronically Signed   By: Van Clines M.D.   On: 04/16/2015 11:29   Ct Hip Right Wo Contrast  04/05/2015  CLINICAL DATA:  Status post fall today with right hip injury and pain. Initial encounter. EXAM: CT OF THE RIGHT HIP WITHOUT CONTRAST TECHNIQUE: Multidetector CT imaging of the right hip was performed according to the standard protocol. Multiplanar CT image reconstructions were also generated. COMPARISON:  Plain films of the right hip earlier today. FINDINGS: There is no fracture or dislocation. The patient has severe right hip osteoarthritis with bone-on-bone joint space narrowing, subchondral sclerosis and very bulky osteophytosis. Musculature about the right hip is intact and normal in appearance. Imaged intrapelvic contents demonstrate some atherosclerosis but are otherwise unremarkable. IMPRESSION: No acute abnormality. Severe right hip osteoarthritis. Electronically Signed   By: Inge Rise M.D.   On: 04/05/2015 13:15   Dg Hip Unilat W Or W/o Pelvis 2-3 Views Right  04/05/2015  CLINICAL DATA:  Fall today, pain lower back and right hip EXAM: DG HIP (WITH OR WITHOUT PELVIS) 2-3V RIGHT COMPARISON:  None. FINDINGS: There is significant osteoarthritis of the right hip associated with moderate to severe deformity. There is loss of joint space. Fragmentation around the joint is likely related to large osteophytes at the acetabulum at as well as the femoral head. However it is difficult to exclude a minimally displaced fracture. There is no evidence for dislocation. Degenerative changes are seen in the left hip to lesser degree. IMPRESSION: 1. Significant  deformity of the right hip, likely related entirely to degenerative changes. 2. However, because a minimally displaced fracture is not excluded, consider CT of the hip for further to exclude fracture in the setting of trauma. Electronically Signed   By: Nolon Nations M.D.   On: 04/05/2015 11:29    Microbiology: Recent Results (from the past 240 hour(s))  Blood Culture (routine x 2)     Status: None   Collection Time: 04/15/15 11:07 AM  Result Value Ref Range Status   Specimen Description BLOOD RIGHT HAND DRAWN BY RN  Final   Special Requests BOTTLES DRAWN AEROBIC ONLY 6CC  Final   Culture NO GROWTH 5 DAYS  Final   Report Status 04/20/2015 FINAL  Final  Blood Culture (routine x 2)     Status: None   Collection Time: 04/15/15 11:08 AM  Result Value Ref Range Status   Specimen Description BLOOD RIGHT ANTECUBITAL DRAWN BY RN  Final   Special Requests BOTTLES DRAWN AEROBIC ONLY 5CC  Final   Culture NO GROWTH 5 DAYS  Final   Report Status 04/20/2015 FINAL  Final  Urine culture     Status: None   Collection Time: 04/15/15  1:08 PM  Result Value Ref Range Status   Specimen Description URINE, CLEAN CATCH  Final   Special Requests NONE  Final   Culture   Final    >=100,000 COLONIES/mL PROTEUS MIRABILIS Performed at [facility]    Report Status 04/18/2015 FINAL  Final   Organism ID, Bacteria PROTEUS MIRABILIS  Final      Susceptibility   Proteus mirabilis - MIC*    AMPICILLIN 8 SENSITIVE Sensitive     CEFAZOLIN 8 SENSITIVE Sensitive     CEFTRIAXONE <=1 SENSITIVE Sensitive     CIPROFLOXACIN >=4 RESISTANT Resistant     GENTAMICIN 8 INTERMEDIATE Intermediate     IMIPENEM 4 SENSITIVE Sensitive     NITROFURANTOIN 128 RESISTANT Resistant     TRIMETH/SULFA >=320 RESISTANT Resistant     AMPICILLIN/SULBACTAM 4 SENSITIVE Sensitive     PIP/TAZO <=4 SENSITIVE Sensitive     * >=100,000 COLONIES/mL PROTEUS MIRABILIS  MRSA PCR Screening     Status: None   Collection Time: 04/16/15  3:30  AM  Result Value Ref Range Status   MRSA by PCR NEGATIVE NEGATIVE Final    Comment:        The GeneXpert MRSA Assay (FDA approved for NASAL specimens only), is one component of a comprehensive MRSA colonization surveillance program. It is not intended to diagnose MRSA infection nor to guide or monitor treatment for MRSA infections.      Labs: Basic Metabolic Panel:  Recent Labs Lab 04/17/15 0517 04/18/15 0439 04/19/15 0654 04/20/15 0656 04/21/15 0652  NA 141 139 141 141 141  K 4.1 3.5 3.6 3.4* 4.0  CL 109 106 103 103 103  CO2 21* 25 30 30 30   GLUCOSE 261* 175* 179*  145* 138*  BUN 76* 35* 16 9 6   CREATININE 3.45* 1.46* 0.98 0.77 0.73  CALCIUM 8.0* 7.9* 7.5* 7.3* 7.7*   Liver Function Tests:  Recent Labs Lab 04/15/15 1106 04/16/15 0518  AST 22 22  ALT 10* 12*  ALKPHOS 66 61  BILITOT 0.5 0.6  PROT 6.2* 5.7*  ALBUMIN 3.1* 2.7*   No results for input(s): LIPASE, AMYLASE in the last 168 hours. No results for input(s): AMMONIA in the last 168 hours. CBC:  Recent Labs Lab 04/15/15 1106  04/17/15 0517 04/17/15 2018 04/18/15 0439 04/19/15 0654 04/20/15 0656  WBC 4.6  < > 3.9* 4.2 3.8* 3.0* 3.6*  NEUTROABS 2.2  --   --   --   --   --   --   HGB 8.3*  < > 7.4* 9.9* 9.3* 10.0* 9.7*  HCT 27.3*  < > 24.6* 31.8* 29.6* 33.8* 33.0*  MCV 71.8*  < > 72.4* 74.8* 74.2* 76.1* 77.6*  PLT 198  < > 173 167 181 188 195  < > = values in this interval not displayed. Cardiac Enzymes: No results for input(s): CKTOTAL, CKMB, CKMBINDEX, TROPONINI in the last 168 hours. BNP: BNP (last 3 results) No results for input(s): BNP in the last 8760 hours.  ProBNP (last 3 results) No results for input(s): PROBNP in the last 8760 hours.  CBG:  Recent Labs Lab 04/20/15 1141 04/20/15 1646 04/20/15 2053 04/21/15 0728 04/21/15 1129  GLUCAP 180* 121* 132* 131* 167*       Signed:  Shajuana Mclucas MD.  [facility] 04/21/2015, 1:53 PM

## 2015-04-21 NOTE — Care Management Note (Signed)
Case Management Note  Patient Details  Name: Felicia Grant MRN: [id number] Date of Birth: 1945-06-10  Expected Discharge Date:  04/18/15               Expected Discharge Plan:  Assisted Living / Rest Home  In-House Referral:  Clinical Social Work  Discharge planning Services  CM Consult  Post Acute Care Choice:  NA Choice offered to:  NA  DME Arranged:    DME Agency:     HH Arranged:    [place] Agency:     Status of Service:  Completed, signed off  Medicare Important Message Given:  Yes Date Medicare IM Given:    Medicare IM give by:    Date Additional Medicare IM Given:    Additional Medicare Important Message give by:     If discussed at [place] of Stay Meetings, dates discussed:  04/21/2015  Additional Comments: Pt returning to ALF today. CSW is aware and has arranged for return to facility. No CM needs noted.   Sherald Barge, RN 04/21/2015, 4:40 PM

## 2015-04-21 NOTE — Evaluation (Signed)
Physical Therapy Evaluation Patient Details Name: Felicia Grant MRN: [id number] DOB: 1945/08/14 Today's Date: 04/21/2015   History of Present Illness  Pt is a 55 yoF admitted to [facility] with nausea and vomiting. PMH includes dementia. schizophrenia, COPD, DM and HTN.   Clinical Impression  Pt was awake and willing to participate in therapy. She was pleasant and oriented to place only. She demonstrates indep with bed mobility, however she requires CGA and supervision during transfers and ambulation with AD for safety. She has poor safety awareness when using her walker, repeatedly placing her hands on the front of the walker, even with therapist correcting hand placement. Her home situation is unclear, and she doesn't want to go back to the ALF stating she has "25,000 children that can take care of me". At this time, she would require supervision for OOB/mobility activity to decrease her risk of falls and injury. She will benefit from PT services while at [facility] to improve her mobility and safety with use of AD.     Follow Up Recommendations Supervision for mobility/OOB (For safety with RW)    Equipment Recommendations  None recommended by PT    Recommendations for Other Services       Precautions / Restrictions Restrictions Weight Bearing Restrictions: No      Mobility  Bed Mobility Overal bed mobility: Independent                Transfers Overall transfer level: Needs assistance Equipment used: Standard walker Transfers: Sit to/from Stand Sit to Stand: Min guard         General transfer comment: CGA for safety only  Ambulation/Gait Ambulation/Gait assistance: Min guard Ambulation Distance (Feet): 12 Feet Assistive device: Standard walker Gait Pattern/deviations: Decreased step length - left;Decreased stance time - right;Trunk flexed;Wide base of support   Gait velocity interpretation: <1.8 ft/sec, indicative of risk for recurrent falls General Gait Details: Pt with  poor hand placement on walker during ambulation. Therapist unable to correct as pt continued to replace her hands stating "this is how I do it at home"  Stairs            Wheelchair Mobility    Modified Rankin (Stroke Patients Only)       Balance Overall balance assessment: No apparent balance deficits (not formally assessed)                                           Pertinent Vitals/Pain Pain Assessment: No/denies pain    Home Living Family/patient expects to be discharged to:: Assisted living               Home Equipment: Walker - 2 wheels (per pt report)      Prior Function Level of Independence: Needs assistance   Gait / Transfers Assistance Needed: RW per pt report, also notes she needs help "squatting"  ADL's / Homemaking Assistance Needed: Unsure of level of assistance received at current ALF        Hand Dominance        Extremity/Trunk Assessment   Upper Extremity Assessment: Overall WFL for tasks assessed           Lower Extremity Assessment: Overall WFL for tasks assessed         Communication   Communication: No difficulties  Cognition Arousal/Alertness: Awake/alert Behavior During Therapy: WFL for tasks assessed/performed Overall Cognitive Status: No family/caregiver present  to determine baseline cognitive functioning (poor historian, with scattered information)                      General Comments      Exercises        Assessment/Plan    PT Assessment Patient needs continued PT services  PT Diagnosis Abnormality of gait;Difficulty walking   PT Problem List Decreased safety awareness;Decreased knowledge of use of DME  PT Treatment Interventions DME instruction;Gait training;Therapeutic exercise;Therapeutic activities;Patient/family education   PT Goals (Current goals can be found in the Care Plan section) Acute Rehab PT Goals PT Goal Formulation: Patient unable to participate in goal  setting    Frequency Min 2X/week   Barriers to discharge   Cognitive status    Co-evaluation               End of Session Equipment Utilized During Treatment: Gait belt Activity Tolerance: Patient tolerated treatment well Patient left: in bed;with call bell/phone within reach;with bed alarm set           Time: 1110-1125 PT Time Calculation (min) (ACUTE ONLY): 15 min   Charges:   PT Evaluation $PT Eval Low Complexity: 1 Procedure PT Treatments $Therapeutic Activity: 8-22 mins   PT G Codes:        12:04 PM,April 25, 2015 Elly Modena PT, DPT [facility] [phone]

## 2015-04-21 NOTE — Care Management Important Message (Signed)
Important Message  Patient Details  Name: Felicia Grant MRN: [id number] Date of Birth: [birth year]   Medicare Important Message Given:  Yes    Sherald Barge, RN 04/21/2015, 4:40 PM

## 2015-04-21 NOTE — NC FL2 (Deleted)
[place] MEDICAID FL2 LEVEL OF CARE SCREENING TOOL     IDENTIFICATION  Patient Name: Felicia Grant Birthdate: Jan 19, 1946 Sex: female Admission Date (Current Location): 04/15/2015  [facility] and [place] Number:  [employer] and Address:  [place] [address], [place]      Provider Number: [id number]  Attending Physician Name and Address:  Rexene Alberts, MD  Relative Name and Phone Number:       Current Level of Care: Hospital Recommended Level of Care: [place] Prior Approval Number:    Date Approved/Denied:   PASRR Number:    Discharge Plan: Other (Comment) (ALF)    Current Diagnoses: Patient Active Problem List   Diagnosis Date Noted  . 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 Distended bladder 04/16/2015  . Hydroureteronephrosis 04/16/2015  . Sepsis ([place]) 04/15/2015  . Acute pyelonephritis 04/15/2015  . Lower abdominal pain 04/15/2015  . Lactic acidosis 04/15/2015  . Hypotension arterial 04/15/2015  . Nausea and vomiting 04/15/2015  . Diabetes mellitus with complication ([place]) 123456  . Metabolic acidosis 123456  . Schizoaffective schizophrenia ([place]) 04/15/2015  . COPD (chronic obstructive pulmonary disease) ([place]) 04/15/2015  . Parkinson's disease ([place]) 04/15/2015  . Microcytic anemia 04/15/2015  . Cardiac pacemaker in situ 04/15/2015    Orientation RESPIRATION BLADDER Height & Weight     Self  Normal Continent Weight: 187 lb 6.4 oz (85.004 kg) Height:  5\' 4"  (162.6 cm)  BEHAVIORAL SYMPTOMS/MOOD NEUROLOGICAL BOWEL NUTRITION STATUS      Continent    AMBULATORY STATUS COMMUNICATION OF NEEDS Skin   Supervision Verbally Normal                       Personal Care Assistance Level of Assistance  Bathing, Feeding, Dressing Bathing Assistance: Limited assistance Feeding assistance: Limited assistance Dressing Assistance: Limited assistance      Functional Limitations Info             SPECIAL CARE FACTORS FREQUENCY                       Contractures Contractures Info: Not present    Additional Factors Info  Psychotropic       Insulin Sliding Scale Info:  (3x/daily with meals)       Current Medications (04/21/2015):  This is the current hospital active medication list Current Facility-Administered Medications  Medication Dose Route Frequency Provider Last Rate Last Dose  . 0.9 %  sodium chloride infusion  250 mL Intravenous PRN Rexene Alberts, MD      . acetaminophen (TYLENOL) tablet 650 mg  650 mg Oral Q6H PRN Rexene Alberts, MD   650 mg at 04/20/15 1008   Or  . acetaminophen (TYLENOL) suppository 650 mg  650 mg Rectal Q6H PRN Rexene Alberts, MD      . albuterol (PROVENTIL) (2.5 MG/3ML) 0.083% nebulizer solution 2.5 mg  2.5 mg Nebulization Q2H PRN Rexene Alberts, MD      . alum & mag hydroxide-simeth (MAALOX/MYLANTA) 200-200-20 MG/5ML suspension 30 mL  30 mL Oral Q6H PRN Rexene Alberts, MD      . amLODipine (NORVASC) tablet 5 mg  5 mg Oral Daily Rexene Alberts, MD   5 mg at 04/21/15 0805  . antiseptic oral rinse (CPC / CETYLPYRIDINIUM CHLORIDE 0.05%) solution 7 mL  7 mL Mouth Rinse BID Rexene Alberts, MD   7 mL at 04/21/15 1000  . ferrous sulfate  tablet 325 mg  325 mg Oral Q breakfast Rexene Alberts, MD   325 mg at 04/21/15 0805  . insulin aspart (novoLOG) injection 0-15 Units  0-15 Units Subcutaneous TID WC Rexene Alberts, MD   2 Units at 04/21/15 0805  . insulin aspart (novoLOG) injection 0-5 Units  0-5 Units Subcutaneous QHS Rexene Alberts, MD   2 Units at 04/17/15 2044  . morphine 4 MG/ML injection 3 mg  3 mg Intravenous Q2H PRN Rexene Alberts, MD   3 mg at 04/16/15 0750  . ondansetron (ZOFRAN) injection 4 mg  4 mg Intravenous 4 times per day Rexene Alberts, MD   4 mg at 04/21/15 0553  . pantoprazole (PROTONIX) EC tablet 40 mg  40 mg Oral Daily Rexene Alberts, MD   40 mg at 04/21/15 0805  . risperiDONE (RISPERDAL)  tablet 1 mg  1 mg Oral QHS Rexene Alberts, MD   1 mg at 04/20/15 2101  . sodium chloride flush (NS) 0.9 % injection 3 mL  3 mL Intravenous Q12H Rexene Alberts, MD   3 mL at 04/21/15 0807  . sodium chloride flush (NS) 0.9 % injection 3 mL  3 mL Intravenous PRN Rexene Alberts, MD         Discharge Medications: Please see discharge summary for a list of discharge medications.  Relevant Imaging Results:  Relevant Lab Results:   Additional Information    Deaundra Kutzer M, LCSW

## 2015-04-21 NOTE — Progress Notes (Signed)
Discharged PT per MD order and protocol. Reviewed discharge teaching prescriptions given to [facility] staff member. Pt verbalized understanding and left with all belongings.  [facility] and gave report to [place].  VSS. IV catheters D/C.  Patient taken out by [facility] staff member. Oswald Hillock, RN

## 2015-04-21 NOTE — NC FL2 (Signed)
[place] MEDICAID FL2 LEVEL OF CARE SCREENING TOOL     IDENTIFICATION  Patient Name: Felicia Grant Birthdate: 17-Oct-1945 Sex: female Admission Date (Current Location): 04/15/2015  [facility] and [place] Number:  [employer] and Address:  [place] [address] [place]      Provider Number: [id number]  Attending Physician Name and Address:  Rexene Alberts, MD  Relative Name and Phone Number:       Current Level of Care: Hospital Recommended Level of Care: [place] Prior Approval Number:    Date Approved/Denied:   PASRR Number:    Discharge Plan: SNF    Current Diagnoses: Patient Active Problem List   Diagnosis Date Noted  . 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 Distended bladder 04/16/2015  . Hydroureteronephrosis 04/16/2015  . Sepsis ([place]) 04/15/2015  . Acute pyelonephritis 04/15/2015  . Lower abdominal pain 04/15/2015  . Lactic acidosis 04/15/2015  . Hypotension arterial 04/15/2015  . Nausea and vomiting 04/15/2015  . Diabetes mellitus with complication ([place]) 123456  . Metabolic acidosis 123456  . Schizoaffective schizophrenia ([place]) 04/15/2015  . COPD (chronic obstructive pulmonary disease) ([place]) 04/15/2015  . Parkinson's disease ([place]) 04/15/2015  . Microcytic anemia 04/15/2015  . Cardiac pacemaker in situ 04/15/2015    Orientation RESPIRATION BLADDER Height & Weight     Self  Normal Continent Weight: 187 lb 6.4 oz (85.004 kg) Height:  5\' 4"  (162.6 cm)  BEHAVIORAL SYMPTOMS/MOOD NEUROLOGICAL BOWEL NUTRITION STATUS      Continent    AMBULATORY STATUS COMMUNICATION OF NEEDS Skin   Supervision Verbally Normal                       Personal Care Assistance Level of Assistance  Bathing, Feeding, Dressing Bathing Assistance: Limited assistance Feeding assistance: Limited assistance Dressing Assistance: Limited assistance     Functional Limitations Info              SPECIAL CARE FACTORS FREQUENCY                       Contractures Contractures Info: Not present    Additional Factors Info  Psychotropic       Insulin Sliding Scale Info:  (3x/daily with meals)       Current Medications (04/21/2015):  This is the current hospital active medication list Current Facility-Administered Medications  Medication Dose Route Frequency Provider Last Rate Last Dose  . 0.9 %  sodium chloride infusion  250 mL Intravenous PRN Rexene Alberts, MD      . acetaminophen (TYLENOL) tablet 650 mg  650 mg Oral Q6H PRN Rexene Alberts, MD   650 mg at 04/20/15 1008   Or  . acetaminophen (TYLENOL) suppository 650 mg  650 mg Rectal Q6H PRN Rexene Alberts, MD      . albuterol (PROVENTIL) (2.5 MG/3ML) 0.083% nebulizer solution 2.5 mg  2.5 mg Nebulization Q2H PRN Rexene Alberts, MD      . alum & mag hydroxide-simeth (MAALOX/MYLANTA) 200-200-20 MG/5ML suspension 30 mL  30 mL Oral Q6H PRN Rexene Alberts, MD      . amLODipine (NORVASC) tablet 5 mg  5 mg Oral Daily Rexene Alberts, MD   5 mg at 04/21/15 0805  . antiseptic oral rinse (CPC / CETYLPYRIDINIUM CHLORIDE 0.05%) solution 7 mL  7 mL Mouth Rinse BID Rexene Alberts, MD   7 mL at 04/21/15 1000  . ferrous sulfate tablet 325  mg  325 mg Oral Q breakfast Rexene Alberts, MD   325 mg at 04/21/15 0805  . insulin aspart (novoLOG) injection 0-15 Units  0-15 Units Subcutaneous TID WC Rexene Alberts, MD   2 Units at 04/21/15 0805  . insulin aspart (novoLOG) injection 0-5 Units  0-5 Units Subcutaneous QHS Rexene Alberts, MD   2 Units at 04/17/15 2044  . morphine 4 MG/ML injection 3 mg  3 mg Intravenous Q2H PRN Rexene Alberts, MD   3 mg at 04/16/15 0750  . ondansetron (ZOFRAN) injection 4 mg  4 mg Intravenous 4 times per day Rexene Alberts, MD   4 mg at 04/21/15 0553  . pantoprazole (PROTONIX) EC tablet 40 mg  40 mg Oral Daily Rexene Alberts, MD   40 mg at 04/21/15 0805  . risperiDONE (RISPERDAL) tablet 1 mg  1 mg Oral QHS Rexene Alberts, MD   1 mg at 04/20/15 2101  . sodium chloride flush (NS) 0.9 % injection 3 mL  3 mL Intravenous Q12H Rexene Alberts, MD   3 mL at 04/21/15 0807  . sodium chloride flush (NS) 0.9 % injection 3 mL  3 mL Intravenous PRN Rexene Alberts, MD         Discharge Medications: Please see discharge summary for a list of discharge medications.  Relevant Imaging Results:  Relevant Lab Results:   Additional Information    Burrell Hodapp M, LCSW

## 2015-05-01 ENCOUNTER — Ambulatory Visit (INDEPENDENT_AMBULATORY_CARE_PROVIDER_SITE_OTHER): Payer: Medicare Other | Admitting: Obstetrics and Gynecology

## 2015-05-01 ENCOUNTER — Encounter: Payer: Self-pay | Admitting: Obstetrics and Gynecology

## 2015-05-01 VITALS — BP 120/60 | Ht 64.0 in | Wt 200.0 lb

## 2015-05-01 DIAGNOSIS — N390 Urinary tract infection, site not specified: Secondary | ICD-10-CM | POA: Diagnosis not present

## 2015-05-01 DIAGNOSIS — C52 Malignant neoplasm of vagina: Secondary | ICD-10-CM | POA: Insufficient documentation

## 2015-05-01 DIAGNOSIS — N9089 Other specified noninflammatory disorders of vulva and perineum: Secondary | ICD-10-CM | POA: Insufficient documentation

## 2015-05-01 DIAGNOSIS — N898 Other specified noninflammatory disorders of vagina: Secondary | ICD-10-CM | POA: Diagnosis not present

## 2015-05-01 NOTE — Patient Instructions (Signed)
Topical silvadene by support team daily Oral diflucan 150 mg daily x 5 d Dry vulva regimen as much as possible.

## 2015-05-01 NOTE — Progress Notes (Signed)
Patient ID: Felicia Grant, female   DOB: [birth year], 70 y.o.   MRN: [id number] Pt here today for follow up from ER visit. Pt caregiver states that since the pt has come back from the hospital she has been broke out with sores from her bottom, and vaginal area and the pt states that its up inside her also.

## 2015-05-01 NOTE — Progress Notes (Addendum)
[facility] Visit  Patient name: Felicia Grant MRN [id number]  Date of birth: 01-31-1946  CC & HPI: vaginal irritation, much worse x 1 wk. Recent dx Cancer vagina  Felicia Grant is a 70 y.o. female presenting today with a PMHx of dementia, HTN, DM, and vaginal tumor for a follow up from an ED visit. Pt stays at [facility]. Pt caregiver notes that the pt has since broke out with sores to her bottom and vaginal area. Pt thinks that the sores are inside her vaginal canal as well. Pt has not tried any medications for the relief of her symptoms. Pt denies any other symptoms. Pt has an appointment with GYN Oncology on 05/04/15.   ROS:   review of systems was obtained and all systems are negative except as noted in the HPI and PMH.  Level 5 care due to incapacity, Pertinent History Reviewed:   Reviewed: Significant for Dementia, HTN, DM, Vaginal tumor Medical         Past Medical History  Diagnosis Date  . Dementia   . Bradycardia   . Hypertension   . Diabetes mellitus without complication ([place])   . Parkinson's disease ([place])   . Schizo affective schizophrenia ([place])   . COPD (chronic obstructive pulmonary disease) ([place])   . Cardiac pacemaker in situ   . 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 Acute blood loss anemia 04/15/2015    Secondary to bleeding vaginal mass.                               Surgical Hx:    Past Surgical History  Procedure Laterality Date  . Hip surgery     Medications: Reviewed & Updated - see associated section                       Current outpatient prescriptions:  .  acetaminophen (TYLENOL) 325 MG tablet, Take 650 mg by mouth every 6 (six) hours as needed for mild pain or moderate pain., Disp: , Rfl:  .  amLODipine (NORVASC) 5 MG tablet, Take 5 mg by mouth daily., Disp: , Rfl:  .  cefUROXime (CEFTIN) 500 MG tablet, Take 1 tablet (500 mg total) by mouth 2 (two) times daily with a meal. Antibiotic to be taken for 3 more days., Disp: 6 tablet,  Rfl: 0 .  cetirizine (ZYRTEC) 10 MG tablet, Take 10 mg by mouth daily., Disp: , Rfl:  .  cholecalciferol (VITAMIN D) 400 units TABS tablet, Take 400 Units by mouth daily., Disp: , Rfl:  .  docusate sodium (COLACE) 100 MG capsule, Take 100 mg by mouth 2 (two) times daily as needed for mild constipation., Disp: , Rfl:  .  ferrous sulfate 325 (65 FE) MG tablet, Take 325 mg by mouth at bedtime., Disp: , Rfl:  .  lisinopril (PRINIVIL,ZESTRIL) 40 MG tablet, Take 40 mg by mouth daily., Disp: , Rfl:  .  metFORMIN (GLUCOPHAGE) 500 MG tablet, Take 500-1,000 mg by mouth 2 (two) times daily with a meal. 2 tablets every morning and 1 tablet every evening, Disp: , Rfl:  .  nystatin cream (MYCOSTATIN), Apply 1 application topically 2 (two) times daily., Disp: , Rfl:  .  risperiDONE (RISPERDAL) 1 MG tablet, Take 1 mg by mouth at bedtime., Disp: , Rfl:  .  senna-docusate (TGT SENNA LAX/STOOL SOFTENER) 8.6-50 MG tablet, Take 1 tablet by mouth daily., Disp: ,  Rfl:  .  simvastatin (ZOCOR) 40 MG tablet, Take 40 mg by mouth at bedtime., Disp: , Rfl:    Social History: Reviewed -  reports that she has never smoked. She has quit using smokeless tobacco.  Objective Findings:  Vitals: Blood pressure 120/60, height 5\' 4"  (1.626 m), weight 200 lb (90.719 kg).  Physical Examination: General appearance - alert, well appearing, and in no distress and oriented to person, place, and time Mental status - alert, oriented to person, place, and time, normal mood, behavior, speech, dress, motor activity, and thought processes Pelvic - severe vulvar and lower abdomen irritation and excoriation, apparently secondary to vaginal discharge   Assessment & Plan:   A:  1. Rule out UTI 2. 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 2. Severe vulvar and lower abdominal skin irriation due to vaginal discharge from cancer of vagina/?cervix.  P:  1. Silvadene Cream Rx to skin 2. Diflucan Rx 150 mg qd x 5d     Culture urine done 3 pt  has appt Felicia Grant on Monday for attempt at disposition and plan of care.  4. Acuty of care may soon exceed care at [facility]. By signing my name below, I, Felicia Grant, attest that this documentation has been prepared under the direction and in the presence of Felicia Kind, MD. Electronically Signed: Soijett Grant, ED Scribe. 05/01/2015. 9:55 AM.   . I personally performed the services described in this documentation, which was SCRIBED in my presence. The recorded information has been reviewed and considered accurate. It has been edited as necessary during review. Felicia Kind, MD

## 2015-05-02 LAB — URINE CULTURE

## 2015-05-03 ENCOUNTER — Encounter (HOSPITAL_COMMUNITY): Payer: Self-pay | Admitting: Emergency Medicine

## 2015-05-03 ENCOUNTER — Inpatient Hospital Stay (HOSPITAL_COMMUNITY)
Admission: EM | Admit: 2015-05-03 | Discharge: 2015-05-10 | DRG: 699 | Disposition: A | Discharge status: Skilled Nursing Facility | Payer: Medicare Other | Attending: Internal Medicine | Admitting: Internal Medicine

## 2015-05-03 DIAGNOSIS — N939 Abnormal uterine and vaginal bleeding, unspecified: Secondary | ICD-10-CM | POA: Diagnosis present

## 2015-05-03 DIAGNOSIS — N938 Other specified abnormal uterine and vaginal bleeding: Secondary | ICD-10-CM | POA: Diagnosis not present

## 2015-05-03 DIAGNOSIS — Z950 Presence of cardiac pacemaker: Secondary | ICD-10-CM | POA: Diagnosis not present

## 2015-05-03 DIAGNOSIS — L304 Erythema intertrigo: Secondary | ICD-10-CM | POA: Diagnosis present

## 2015-05-03 DIAGNOSIS — R509 Fever, unspecified: Secondary | ICD-10-CM

## 2015-05-03 DIAGNOSIS — D630 Anemia in neoplastic disease: Secondary | ICD-10-CM | POA: Diagnosis present

## 2015-05-03 DIAGNOSIS — F259 Schizoaffective disorder, unspecified: Secondary | ICD-10-CM | POA: Diagnosis present

## 2015-05-03 DIAGNOSIS — T83518A Infection and inflammatory reaction due to other urinary catheter, initial encounter: Principal | ICD-10-CM | POA: Diagnosis present

## 2015-05-03 DIAGNOSIS — Y846 Urinary catheterization as the cause of abnormal reaction of the patient, or of later complication, without mention of misadventure at the time of the procedure: Secondary | ICD-10-CM | POA: Diagnosis present

## 2015-05-03 DIAGNOSIS — C52 Malignant neoplasm of vagina: Secondary | ICD-10-CM | POA: Diagnosis present

## 2015-05-03 DIAGNOSIS — E119 Type 2 diabetes mellitus without complications: Secondary | ICD-10-CM | POA: Diagnosis present

## 2015-05-03 DIAGNOSIS — G20 Parkinson's disease: Secondary | ICD-10-CM | POA: Diagnosis present

## 2015-05-03 DIAGNOSIS — B9561 Methicillin susceptible Staphylococcus aureus infection as the cause of diseases classified elsewhere: Secondary | ICD-10-CM | POA: Diagnosis present

## 2015-05-03 DIAGNOSIS — B9562 Methicillin resistant Staphylococcus aureus infection as the cause of diseases classified elsewhere: Secondary | ICD-10-CM | POA: Diagnosis present

## 2015-05-03 DIAGNOSIS — N1330 Unspecified hydronephrosis: Secondary | ICD-10-CM | POA: Diagnosis present

## 2015-05-03 DIAGNOSIS — Z7984 Long term (current) use of oral hypoglycemic drugs: Secondary | ICD-10-CM

## 2015-05-03 DIAGNOSIS — F250 Schizoaffective disorder, bipolar type: Secondary | ICD-10-CM | POA: Diagnosis not present

## 2015-05-03 DIAGNOSIS — J449 Chronic obstructive pulmonary disease, unspecified: Secondary | ICD-10-CM | POA: Diagnosis present

## 2015-05-03 DIAGNOSIS — F0390 Unspecified dementia without behavioral disturbance: Secondary | ICD-10-CM | POA: Diagnosis not present

## 2015-05-03 DIAGNOSIS — N390 Urinary tract infection, site not specified: Secondary | ICD-10-CM | POA: Diagnosis present

## 2015-05-03 DIAGNOSIS — Z79899 Other long term (current) drug therapy: Secondary | ICD-10-CM | POA: Diagnosis not present

## 2015-05-03 DIAGNOSIS — R001 Bradycardia, unspecified: Secondary | ICD-10-CM | POA: Diagnosis present

## 2015-05-03 DIAGNOSIS — D4959 Neoplasm of unspecified behavior of other genitourinary organ: Secondary | ICD-10-CM

## 2015-05-03 DIAGNOSIS — Z515 Encounter for palliative care: Secondary | ICD-10-CM | POA: Diagnosis present

## 2015-05-03 DIAGNOSIS — D709 Neutropenia, unspecified: Secondary | ICD-10-CM | POA: Diagnosis not present

## 2015-05-03 DIAGNOSIS — Z66 Do not resuscitate: Secondary | ICD-10-CM | POA: Diagnosis present

## 2015-05-03 DIAGNOSIS — Z7189 Other specified counseling: Secondary | ICD-10-CM | POA: Insufficient documentation

## 2015-05-03 DIAGNOSIS — R7881 Bacteremia: Secondary | ICD-10-CM | POA: Diagnosis present

## 2015-05-03 DIAGNOSIS — N320 Bladder-neck obstruction: Secondary | ICD-10-CM | POA: Diagnosis present

## 2015-05-03 DIAGNOSIS — D649 Anemia, unspecified: Secondary | ICD-10-CM | POA: Diagnosis not present

## 2015-05-03 DIAGNOSIS — N9089 Other specified noninflammatory disorders of vulva and perineum: Secondary | ICD-10-CM

## 2015-05-03 DIAGNOSIS — Z452 Encounter for adjustment and management of vascular access device: Secondary | ICD-10-CM

## 2015-05-03 LAB — URINALYSIS, ROUTINE W REFLEX MICROSCOPIC
Bilirubin Urine: NEGATIVE
GLUCOSE, UA: NEGATIVE mg/dL
Ketones, ur: NEGATIVE mg/dL
Nitrite: NEGATIVE
SPECIFIC GRAVITY, URINE: 1.015 (ref 1.005–1.030)
pH: 6 (ref 5.0–8.0)

## 2015-05-03 LAB — I-STAT CG4 LACTIC ACID, ED: LACTIC ACID, VENOUS: 1.54 mmol/L (ref 0.5–2.0)

## 2015-05-03 LAB — URINE MICROSCOPIC-ADD ON: Squamous Epithelial / LPF: NONE SEEN

## 2015-05-03 LAB — CBC WITH DIFFERENTIAL/PLATELET
Basophils Absolute: 0 10*3/uL (ref 0.0–0.1)
Basophils Relative: 0 %
Eosinophils Absolute: 0.2 10*3/uL (ref 0.0–0.7)
Eosinophils Relative: 6 %
HEMATOCRIT: 32.6 % — AB (ref 36.0–46.0)
HEMOGLOBIN: 9.7 g/dL — AB (ref 12.0–15.0)
LYMPHS ABS: 1 10*3/uL (ref 0.7–4.0)
LYMPHS PCT: 29 %
MCH: 24.1 pg — AB (ref 26.0–34.0)
MCHC: 29.8 g/dL — AB (ref 30.0–36.0)
MCV: 81.1 fL (ref 78.0–100.0)
MONOS PCT: 29 %
Monocytes Absolute: 1 10*3/uL (ref 0.1–1.0)
NEUTROS ABS: 1.2 10*3/uL — AB (ref 1.7–7.7)
NEUTROS PCT: 36 %
Platelets: 219 10*3/uL (ref 150–400)
RBC: 4.02 MIL/uL (ref 3.87–5.11)
RDW: 18.8 % — ABNORMAL HIGH (ref 11.5–15.5)
WBC: 3.4 10*3/uL — AB (ref 4.0–10.5)

## 2015-05-03 LAB — BASIC METABOLIC PANEL
ANION GAP: 7 (ref 5–15)
BUN: 15 mg/dL (ref 6–20)
CHLORIDE: 106 mmol/L (ref 101–111)
CO2: 27 mmol/L (ref 22–32)
Calcium: 7.9 mg/dL — ABNORMAL LOW (ref 8.9–10.3)
Creatinine, Ser: 0.85 mg/dL (ref 0.44–1.00)
GFR calc non Af Amer: >60 mL/min (ref >60)
Glucose, Bld: 133 mg/dL — ABNORMAL HIGH (ref 65–99)
POTASSIUM: 4.8 mmol/L (ref 3.5–5.1)
Sodium: 140 mmol/L (ref 135–145)

## 2015-05-03 LAB — LACTIC ACID, PLASMA: Lactic Acid, Venous: 1.8 mmol/L (ref 0.5–2.0)

## 2015-05-03 MED ORDER — SODIUM CHLORIDE 0.9 % IV BOLUS (SEPSIS)
1000.0000 mL | Freq: Once | INTRAVENOUS | Status: AC
  Administered 2015-05-03: 1000 mL via INTRAVENOUS

## 2015-05-03 MED ORDER — SODIUM CHLORIDE 0.9 % IV BOLUS (SEPSIS)
2000.0000 mL | Freq: Once | INTRAVENOUS | Status: AC
  Administered 2015-05-03: 2000 mL via INTRAVENOUS

## 2015-05-03 MED ORDER — DEXTROSE 5 % IV SOLN
1.0000 g | INTRAVENOUS | Status: DC
  Administered 2015-05-03: 1 g via INTRAVENOUS
  Filled 2015-05-03 (×2): qty 10

## 2015-05-03 MED ORDER — ACETAMINOPHEN 325 MG PO TABS
650.0000 mg | ORAL_TABLET | Freq: Once | ORAL | Status: AC
  Administered 2015-05-03: 650 mg via ORAL
  Filled 2015-05-03: qty 2

## 2015-05-03 MED ORDER — SODIUM CHLORIDE 0.9 % IV SOLN
INTRAVENOUS | Status: DC
  Administered 2015-05-03 – 2015-05-04 (×2): via INTRAVENOUS

## 2015-05-03 MED ORDER — SODIUM CHLORIDE 0.9 % IV BOLUS (SEPSIS): 1000.0000 mL | Freq: Once | INTRAVENOUS | Status: DC

## 2015-05-03 NOTE — ED Notes (Signed)
Last set of VS (77/55) inaccurate

## 2015-05-03 NOTE — Progress Notes (Signed)
Pharmacy Antibiotic Note  Felicia Grant is a 70 y.o. female admitted on 05/03/2015 with UTI.  Pharmacy has been consulted for rocephin dosing.  Plan: Rocephin 1gm IV q24 hours.  No dose adjust needed Pharmacy will sign off.  Please re-consult as needed.    Thank you for allowing pharmacy to be a part of this patient's care.  Beverlee Nims 05/03/2015 8:37 PM

## 2015-05-03 NOTE — ED Notes (Signed)
Patient from [facility] with c/o vaginal bleeding. Facility reports recent dx of vaginal cancer with follow up scheduled. Reports dark red clots noted today from vaginal area.

## 2015-05-03 NOTE — H&P (Signed)
[facility] History and Physical  Kurtis Elbaum [id number] DOB: 1945/12/22 DOA: 05/03/2015  Referring physician: Dr Thurnell Garbe PCP: Jani Gravel, MD   Chief Complaint: Vag bleeding  HPI: Felicia Grant is a 70 y.o. female with hx of dementia, HTN, NIDDM who was recently admitted Mar 1-7, 2017 with acute pyelonephritis/ Proteus UTI, sepsis, bilat hydronephrosis due to obstructing vaginal tumor, AKI.  She was dc'd to SNF with indwelling foley cath and pathology was pending.  Path ultimately showed SCCa.  Patient presenting today to ED with recurrent vag bleeding and irritation of vulvar area and perineum.  Temp n ED is 101 deg, pt not toxic.  UA +tntc wbc's.  ED physician spoke with Dr Glo Herring on call for Ob-Gyn who recommended admission to get patient set up for radiation to her vaginal malignancy.  They said they will work on getting this started tomorrow on Monday.    Patient is confused, but pleasant and conversant.  She denies vag bleeding, says it's some "cough syrup" that is "going right through me".  No CP, no chills no n/v/d, no abd pain.     ROS  denies CP  no joint pain   no HA  no blurry vision  no rash  no diarrhea  no nausea/ vomiting  no dysuria  no difficulty voiding  no change in urine color    Where does patient live SNF Can patient participate in ADLs? somewhat  Past Medical History  Past Medical History  Diagnosis Date  . Dementia   . Bradycardia   . Hypertension   . Diabetes mellitus without complication ([place])   . Parkinson's disease ([place])   . Schizo affective schizophrenia ([place])   . COPD (chronic obstructive pulmonary disease) ([place])   . Cardiac pacemaker in situ   . 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 Acute blood loss anemia 04/15/2015    Secondary to bleeding vaginal mass.    Past Surgical History  Past Surgical History  Procedure Laterality Date  . Hip surgery     Family History No family history on file. Social History  reports that she has  never smoked. She has quit using smokeless tobacco. She reports that she does not drink alcohol or use illicit drugs. Allergies No Known Allergies Home medications Prior to Admission medications   Medication Sig Start Date End Date Taking? Authorizing Provider  amLODipine (NORVASC) 5 MG tablet Take 5 mg by mouth daily.   Yes Historical Provider, MD  cetirizine (ZYRTEC) 10 MG tablet Take 10 mg by mouth daily.   Yes Historical Provider, MD  cholecalciferol (VITAMIN D) 400 units TABS tablet Take 400 Units by mouth daily.   Yes Historical Provider, MD  ferrous sulfate 325 (65 FE) MG tablet Take 325 mg by mouth at bedtime.   Yes Historical Provider, MD  fluconazole (DIFLUCAN) 150 MG tablet Take 150 mg by mouth daily. For 5 days stop 05/05/15   Yes Historical Provider, MD  guaifenesin (COUGH SYRUP) 100 MG/5ML syrup Take 200 mg by mouth every 4 (four) hours as needed for cough.   Yes Historical Provider, MD  guaiFENesin (MUCINEX) 600 MG 12 hr tablet Take 600 mg by mouth at bedtime as needed for cough.   Yes Historical Provider, MD  lisinopril (PRINIVIL,ZESTRIL) 40 MG tablet Take 40 mg by mouth daily.   Yes Historical Provider, MD  metFORMIN (GLUCOPHAGE) 500 MG tablet Take 500-1,000 mg by mouth 2 (two) times daily with a meal. 2 tablets every morning and 1 tablet every evening  Yes Historical Provider, MD  nystatin cream (MYCOSTATIN) Apply 1 application topically 2 (two) times daily.   Yes Historical Provider, MD  risperiDONE (RISPERDAL) 1 MG tablet Take 1 mg by mouth at bedtime.   Yes Historical Provider, MD  senna (SENOKOT) 8.6 MG TABS tablet Take 1 tablet by mouth daily.   Yes Historical Provider, MD  silver sulfADIAZINE (SILVADENE) 1 % cream Apply 1 application topically daily. Apply to vulva lower abdomen daily for severe irritation.   Yes Historical Provider, MD  simvastatin (ZOCOR) 40 MG tablet Take 40 mg by mouth at bedtime.   Yes Historical Provider, MD  acetaminophen (TYLENOL) 325 MG tablet Take  650 mg by mouth every 6 (six) hours as needed for mild pain or moderate pain.    Historical Provider, MD  cefUROXime (CEFTIN) 500 MG tablet Take 1 tablet (500 mg total) by mouth 2 (two) times daily with a meal. Antibiotic to be taken for 3 more days. Patient not taking: Reported on 05/03/2015 04/21/15   Rexene Alberts, MD  docusate sodium (COLACE) 100 MG capsule Take 100 mg by mouth 2 (two) times daily as needed for mild constipation.    Historical Provider, MD   Liver Function Tests No results for input(s): AST, ALT, ALKPHOS, BILITOT, PROT, ALBUMIN in the last 168 hours. No results for input(s): LIPASE, AMYLASE in the last 168 hours. CBC  Recent Labs Lab 05/03/15 1915  WBC 3.4*  NEUTROABS 1.2*  HGB 9.7*  HCT 32.6*  MCV 81.1  PLT [id number]   Basic Metabolic Panel  Recent Labs Lab 05/03/15 1752  NA 140  K 4.8  CL 106  CO2 27  GLUCOSE 133*  BUN 15  CREATININE 0.85  CALCIUM 7.9*     Filed Vitals:   05/03/15 1942 05/03/15 2005 05/03/15 2100 05/03/15 2115  BP: 109/59 130/58 102/67 102/67  Pulse: 68  75 79  Temp: 100.1 F (37.8 C)   101.1 F (38.4 C)  TempSrc: Rectal   Oral  Resp: 18   18  Height:      Weight:      SpO2: 93%  95% 100%   Exam: Gen alert, elderly AAF, obese no distress No rash, cyanosis or gangrene Sclera anicteric, throat clear  No jvd or bruit Chest clear bilat RRR no MRG Abd soft ntnd no mass or ascites +bs Lower abd wall macerated and mildly erythematous GU major/min labia macerated and erythematous, no abscess, foley in place, fresh blood about the introitus MS no joint effusions or deformity Ext no edema / no wounds or ulcer Neuro is alert, Ox hospital, not year or day of the week  EKG (independently reviewed) > not done CXR (independently reviewed) > not done  Home medications: Norvasc, risperdal, diflucan, lisinopril, metformin, senokot, zocor, Ceftin, colace FeSO4, vit D, Zyrtec  Na 140  K 4.8  CO2 27  Creat 0.85  BUN 15   WBC 3.4   Hb  9.7  plt 219 UA TNTC wbc, >300      Assessment: 1. Vaginal bleeding/ recently diagnosed vaginal tumor/ SCCa 2. Recent AKI/ bilat hydro - resolved with foley cath, creat 0.8 3. Dementia - risperdal 4. UTI/ pyuria 5. HTN on acei/ CCB, bp's normal  Plan - admit, IV abx for UTI, IVF, GYN aware and will see tomorrow for setting up XRT   DVT Prophylaxis: SCD  Code Status: full  Family Communication: none here  Disposition Plan: back to SNF when stable    [place] D [facility]  Pager [phone]  Cell [phone]  If 7PM-7AM, please contact night-coverage [email or web address] Password [email or web address] 05/03/2015, 10:11 PM

## 2015-05-03 NOTE — ED Provider Notes (Signed)
CSN: [id number]     Arrival date & time 05/03/15  1713 History   First MD Initiated Contact with Patient 05/03/15 1722     Chief Complaint  Patient presents with  . Vaginal Bleeding      Patient is a 70 y.o. female presenting with vaginal bleeding. The history is provided by the patient, the EMS personnel and the nursing home. The history is limited by the condition of the patient (Hx dementia).  Vaginal Bleeding Pt was seen at 1725. Per EMS, NH report and pt: Pt sent from NH for vaginal bleeding, "low BP" and "low grade fever." Pt has recent admission for dx vaginal tumor (presumed cancer) and sepsis due to UTI. Foley catheter continues in place since hospital discharge 04/21/15. Pt herself has hx of dementia and states she is "ok" and "they just got scared of a little blood."    Past Medical History  Diagnosis Date  . Dementia   . Bradycardia   . Hypertension   . Diabetes mellitus without complication ([place])   . Parkinson's disease ([place])   . Schizo affective schizophrenia ([place])   . COPD (chronic obstructive pulmonary disease) ([place])   . Cardiac pacemaker in situ   . 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 Acute blood loss anemia 04/15/2015    Secondary to bleeding vaginal mass.    Past Surgical History  Procedure Laterality Date  . Hip surgery      Social History  Substance Use Topics  . Smoking status: Never Smoker   . Smokeless tobacco: Former Systems developer  . Alcohol Use: No    Review of Systems  Unable to perform ROS: Dementia  Genitourinary: Positive for vaginal bleeding.      Allergies  Review of patient's allergies indicates no known allergies.  Home Medications   Prior to Admission medications   Medication Sig Start Date End Date Taking? Authorizing Provider  acetaminophen (TYLENOL) 325 MG tablet Take 650 mg by mouth every 6 (six) hours as needed for mild pain or moderate pain.    Historical Provider, MD  amLODipine (NORVASC) 5 MG tablet Take 5 mg by mouth daily.    Historical  Provider, MD  cefUROXime (CEFTIN) 500 MG tablet Take 1 tablet (500 mg total) by mouth 2 (two) times daily with a meal. Antibiotic to be taken for 3 more days. 04/21/15   Rexene Alberts, MD  cetirizine (ZYRTEC) 10 MG tablet Take 10 mg by mouth daily.    Historical Provider, MD  cholecalciferol (VITAMIN D) 400 units TABS tablet Take 400 Units by mouth daily.    Historical Provider, MD  docusate sodium (COLACE) 100 MG capsule Take 100 mg by mouth 2 (two) times daily as needed for mild constipation.    Historical Provider, MD  ferrous sulfate 325 (65 FE) MG tablet Take 325 mg by mouth at bedtime.    Historical Provider, MD  lisinopril (PRINIVIL,ZESTRIL) 40 MG tablet Take 40 mg by mouth daily.    Historical Provider, MD  metFORMIN (GLUCOPHAGE) 500 MG tablet Take 500-1,000 mg by mouth 2 (two) times daily with a meal. 2 tablets every morning and 1 tablet every evening    Historical Provider, MD  nystatin cream (MYCOSTATIN) Apply 1 application topically 2 (two) times daily.    Historical Provider, MD  risperiDONE (RISPERDAL) 1 MG tablet Take 1 mg by mouth at bedtime.    Historical Provider, MD  senna-docusate (TGT SENNA LAX/STOOL SOFTENER) 8.6-50 MG tablet Take 1 tablet by mouth daily.    Historical  Provider, MD  simvastatin (ZOCOR) 40 MG tablet Take 40 mg by mouth at bedtime.    Historical Provider, MD   BP 112/57 mmHg  Pulse 73  Temp(Src) 100 F (37.8 C) (Oral)  Resp 17  Ht 5\' 4"  (1.626 m)  Wt 200 lb (90.719 kg)  BMI 34.31 kg/m2  SpO2 92%   Patient Vitals for the past 24 hrs:  BP Temp Temp src Pulse Resp SpO2 Height Weight  05/03/15 2115 102/67 mmHg 101.1 F (38.4 C) Oral 79 18 100 % - -  05/03/15 2005 130/58 mmHg - - - - - - -  05/03/15 1942 109/59 mmHg 100.1 F (37.8 C) Rectal 68 18 93 % - -  05/03/15 1941 - 100.4 F (38 C) Oral - - - - -  05/03/15 1930 (!) 77/55 mmHg - - 73 - 91 % - -  05/03/15 1915 - - - 71 - 93 % - -  05/03/15 1900 109/58 mmHg - - - - - - -  05/03/15 1830 99/59 mmHg -  - 76 - 100 % - -  05/03/15 1815 - - - 80 - 93 % - -  05/03/15 1800 121/67 mmHg - - - - - - -  05/03/15 1730 153/88 mmHg - - 77 - 94 % - -  05/03/15 1719 112/57 mmHg 100 F (37.8 C) Oral 73 17 92 % 5\' 4"  (1.626 m) 200 lb (90.719 kg)     Physical Exam  1730: Physical examination:  Nursing notes reviewed; Vital signs and O2 SAT reviewed;  Constitutional: Well developed, Well nourished, Well hydrated, In no acute distress; Head:  Normocephalic, atraumatic; Eyes: EOMI, PERRL, No scleral icterus; ENMT: Mouth and pharynx normal, Mucous membranes moist; Neck: Supple, Full range of motion, No lymphadenopathy; Cardiovascular: Regular rate and rhythm, No gallop; Respiratory: Breath sounds clear & equal bilaterally, No wheezes.  Speaking full sentences with ease, Normal respiratory effort/excursion; Chest: Nontender, Movement normal; Abdomen: Soft, Nontender, Nondistended, Normal bowel sounds; Genitourinary: No CVA tenderness. +foley draining clear yellow urine. Perineal exam performed with permission of pt and female ED RN assist during exam.  External genitalia w/ multiple shallow tendernulcers. Vaginal vault currently with scant bleeding, +multiple dark blood clots on peripad.; Extremities: Pulses normal, No tenderness, No edema, No calf edema or asymmetry.; Neuro: Awake, alert, confused per hx dementia. Speech clear. Moves all extremities spontaneously and to command without apparent gross focal motor deficits.; Skin: Color normal, Warm, Dry.    ED Course  Procedures (including critical care time) Labs Review  Imaging Review  I have personally reviewed and evaluated these images and lab results as part of my medical decision-making.   EKG Interpretation None      MDM  MDM Reviewed: previous chart, nursing note and vitals Reviewed previous: labs, ultrasound and CT scan Interpretation: labs Total time providing critical care: 30-74 minutes. This excludes time spent performing separately  reportable procedures and services. Consults: OB/GYN and admitting MD     CRITICAL CARE Performed by: Alfonzo Feller Total critical care time: 35 minutes Critical care time was exclusive of separately billable procedures and treating other patients. Critical care was necessary to treat or prevent imminent or life-threatening deterioration. Critical care was time spent personally by me on the following activities: development of treatment plan with patient and/or surrogate as well as nursing, discussions with consultants, evaluation of patient's response to treatment, examination of patient, obtaining history from patient or surrogate, ordering and performing treatments and interventions, ordering and review of  laboratory studies, ordering and review of radiographic studies, pulse oximetry and re-evaluation of patient's condition.   Results for orders placed or performed during the hospital encounter of [id number]  Basic metabolic panel  Result Value Ref Range   Sodium 140 135 - 145 mmol/L   Potassium 4.8 3.5 - 5.1 mmol/L   Chloride 106 101 - 111 mmol/L   CO2 27 22 - 32 mmol/L   Glucose, Bld 133 (H) 65 - 99 mg/dL   BUN 15 6 - 20 mg/dL   Creatinine, Ser 0.85 0.44 - 1.00 mg/dL   Calcium 7.9 (L) 8.9 - 10.3 mg/dL   GFR calc non Af Amer >60 >60 mL/min   GFR calc Af Amer >60 >60 mL/min   Anion gap 7 5 - 15  CBC with Differential  Result Value Ref Range   WBC 3.4 (L) 4.0 - 10.5 K/uL   RBC 4.02 3.87 - 5.11 MIL/uL   Hemoglobin 9.7 (L) 12.0 - 15.0 g/dL   HCT 32.6 (L) 36.0 - 46.0 %   MCV 81.1 78.0 - 100.0 fL   MCH 24.1 (L) 26.0 - 34.0 pg   MCHC 29.8 (L) 30.0 - 36.0 g/dL   RDW 18.8 (H) 11.5 - 15.5 %   Platelets 219 150 - 400 K/uL   Neutrophils Relative % 36 %   Neutro Abs 1.2 (L) 1.7 - 7.7 K/uL   Lymphocytes Relative 29 %   Lymphs Abs 1.0 0.7 - 4.0 K/uL   Monocytes Relative 29 %   Monocytes Absolute 1.0 0.1 - 1.0 K/uL   Eosinophils Relative 6 %   Eosinophils Absolute 0.2 0.0 - 0.7  K/uL   Basophils Relative 0 %   Basophils Absolute 0.0 0.0 - 0.1 K/uL  Lactic acid, plasma  Result Value Ref Range   Lactic Acid, Venous 1.8 0.5 - 2.0 mmol/L  Urinalysis, Routine w reflex microscopic  Result Value Ref Range   Color, Urine YELLOW YELLOW   APPearance TURBID (A) CLEAR   Specific Gravity, Urine 1.015 1.005 - 1.030   pH 6.0 5.0 - 8.0   Glucose, UA NEGATIVE NEGATIVE mg/dL   Hgb urine dipstick LARGE (A) NEGATIVE   Bilirubin Urine NEGATIVE NEGATIVE   Ketones, ur NEGATIVE NEGATIVE mg/dL   Protein, ur >300 (A) NEGATIVE mg/dL   Nitrite NEGATIVE NEGATIVE   Leukocytes, UA LARGE (A) NEGATIVE  Urine microscopic-add on  Result Value Ref Range   Squamous Epithelial / LPF NONE SEEN NONE SEEN   WBC, UA TOO NUMEROUS TO COUNT 0 - 5 WBC/hpf   RBC / HPF TOO NUMEROUS TO COUNT 0 - 5 RBC/hpf   Bacteria, UA MANY (A) NONE SEEN  I-Stat CG4 Lactic Acid, ED  Result Value Ref Range   Lactic Acid, Venous 1.54 0.5 - 2.0 mmol/L     2025:  Pt with labile BP; IVF bolus given with initial improvement. H/H stable. +UTI, UC pending; will dose IV rocephin.  T/C to OB/GYN Dr. Glo Herring, case discussed, including:  HPI, pertinent PM/SHx, VS/PE, dx testing, ED course and treatment:  He knows pt well, nothing to do at this time acutely for vaginal bleeding, tx for UTI with IV abx, and requests to admit to [facility] tonight, he will facilitate transfer to Elsie Lincoln MD tomorrow.    2130:  Pt now with fever and drop in BP again; will continue IV NS 30mg /kg bolus, obtain BC x2, and dose APAP.  T/C to [facility] Dr. Jonnie Finner, case discussed, including:  HPI, pertinent PM/SHx, VS/PE,  dx testing, ED course and treatment:  Agreeable to admit, requests to write temporary orders, obtain stepdown bed to team APAdmits.     Francine Graven, DO 05/06/15 1818

## 2015-05-04 ENCOUNTER — Ambulatory Visit: Payer: Medicare Other | Admitting: Gynecologic Oncology

## 2015-05-04 DIAGNOSIS — N390 Urinary tract infection, site not specified: Secondary | ICD-10-CM

## 2015-05-04 LAB — CBC
HEMATOCRIT: 31.5 % — AB (ref 36.0–46.0)
HEMOGLOBIN: 9 g/dL — AB (ref 12.0–15.0)
MCH: 23.6 pg — AB (ref 26.0–34.0)
MCHC: 28.6 g/dL — ABNORMAL LOW (ref 30.0–36.0)
MCV: 82.7 fL (ref 78.0–100.0)
Platelets: 189 10*3/uL (ref 150–400)
RBC: 3.81 MIL/uL — AB (ref 3.87–5.11)
RDW: 18.5 % — ABNORMAL HIGH (ref 11.5–15.5)
WBC: 3 10*3/uL — ABNORMAL LOW (ref 4.0–10.5)

## 2015-05-04 LAB — GLUCOSE, CAPILLARY
GLUCOSE-CAPILLARY: 124 mg/dL — AB (ref 65–99)
Glucose-Capillary: 94 mg/dL (ref 65–99)
Glucose-Capillary: 108 mg/dL — ABNORMAL HIGH (ref 65–99)
Glucose-Capillary: 147 mg/dL — ABNORMAL HIGH (ref 65–99)

## 2015-05-04 MED ORDER — ACETAMINOPHEN 325 MG PO TABS: 650.0000 mg | ORAL_TABLET | Freq: Four times a day (QID) | ORAL | Status: DC | PRN

## 2015-05-04 MED ORDER — INSULIN ASPART 100 UNIT/ML ~~LOC~~ SOLN: 0.0000 [IU] | Freq: Every day | SUBCUTANEOUS | Status: DC

## 2015-05-04 MED ORDER — SENNA 8.6 MG PO TABS
1.0000 | ORAL_TABLET | Freq: Every day | ORAL | Status: DC
  Administered 2015-05-04 – 2015-05-09 (×6): 8.6 mg via ORAL
  Filled 2015-05-04 (×7): qty 1

## 2015-05-04 MED ORDER — SIMVASTATIN 20 MG PO TABS
40.0000 mg | ORAL_TABLET | Freq: Every day | ORAL | Status: DC
  Administered 2015-05-04 – 2015-05-09 (×6): 40 mg via ORAL
  Filled 2015-05-04 (×6): qty 2

## 2015-05-04 MED ORDER — FERROUS SULFATE 325 (65 FE) MG PO TABS
325.0000 mg | ORAL_TABLET | Freq: Every day | ORAL | Status: DC
  Administered 2015-05-04 – 2015-05-09 (×6): 325 mg via ORAL
  Filled 2015-05-04 (×6): qty 1

## 2015-05-04 MED ORDER — INSULIN ASPART 100 UNIT/ML ~~LOC~~ SOLN
0.0000 [IU] | Freq: Three times a day (TID) | SUBCUTANEOUS | Status: DC
  Administered 2015-05-04: 1 [IU] via SUBCUTANEOUS
  Administered 2015-05-05: 2 [IU] via SUBCUTANEOUS
  Administered 2015-05-06 (×2): 1 [IU] via SUBCUTANEOUS
  Administered 2015-05-07 – 2015-05-08 (×4): 2 [IU] via SUBCUTANEOUS
  Administered 2015-05-09 – 2015-05-10 (×3): 1 [IU] via SUBCUTANEOUS
  Administered 2015-05-10: 2 [IU] via SUBCUTANEOUS
  Administered 2015-05-10: 1 [IU] via SUBCUTANEOUS

## 2015-05-04 MED ORDER — ONDANSETRON HCL 4 MG PO TABS: 4.0000 mg | ORAL_TABLET | Freq: Four times a day (QID) | ORAL | Status: DC | PRN

## 2015-05-04 MED ORDER — CEFUROXIME AXETIL 250 MG PO TABS
500.0000 mg | ORAL_TABLET | Freq: Two times a day (BID) | ORAL | Status: DC
  Administered 2015-05-04 – 2015-05-05 (×2): 500 mg via ORAL
  Filled 2015-05-04 (×2): qty 2

## 2015-05-04 MED ORDER — POLYETHYLENE GLYCOL 3350 17 G PO PACK: 17.0000 g | PACK | Freq: Every day | ORAL | Status: DC | PRN

## 2015-05-04 MED ORDER — LISINOPRIL 10 MG PO TABS
40.0000 mg | ORAL_TABLET | Freq: Every day | ORAL | Status: DC
  Administered 2015-05-04 – 2015-05-10 (×7): 40 mg via ORAL
  Filled 2015-05-04 (×7): qty 4

## 2015-05-04 MED ORDER — ACETAMINOPHEN 650 MG RE SUPP: 650.0000 mg | Freq: Four times a day (QID) | RECTAL | Status: DC | PRN

## 2015-05-04 MED ORDER — AMLODIPINE BESYLATE 5 MG PO TABS
5.0000 mg | ORAL_TABLET | Freq: Every day | ORAL | Status: DC
  Administered 2015-05-04 – 2015-05-10 (×7): 5 mg via ORAL
  Filled 2015-05-04 (×7): qty 1

## 2015-05-04 MED ORDER — NYSTATIN 100000 UNIT/GM EX POWD
Freq: Two times a day (BID) | CUTANEOUS | Status: DC
  Administered 2015-05-04 – 2015-05-10 (×13): via TOPICAL
  Filled 2015-05-04: qty 30
  Filled 2015-05-04 (×2): qty 15

## 2015-05-04 MED ORDER — RISPERIDONE 1 MG PO TABS
1.0000 mg | ORAL_TABLET | Freq: Every day | ORAL | Status: DC
  Administered 2015-05-04 – 2015-05-07 (×4): 1 mg via ORAL
  Filled 2015-05-04 (×4): qty 1

## 2015-05-04 MED ORDER — ONDANSETRON HCL 4 MG/2ML IJ SOLN: 4.0000 mg | Freq: Four times a day (QID) | INTRAMUSCULAR | Status: DC | PRN

## 2015-05-04 MED ORDER — CHOLECALCIFEROL 10 MCG (400 UNIT) PO TABS
400.0000 [IU] | ORAL_TABLET | Freq: Every day | ORAL | Status: DC
  Administered 2015-05-04 – 2015-05-10 (×7): 400 [IU] via ORAL
  Filled 2015-05-04 (×7): qty 1

## 2015-05-04 NOTE — Progress Notes (Addendum)
PROGRESS NOTE  Felicia Grant [id number] DOB: 1945-06-20 DOA: 05/03/2015 PCP: Jani Gravel, MD  Summary: 20 yof with PMH of dementia, HTN, DM, and recent hospitalization March 1-7 for acute pyelonephritis/ Proteus UTI, sepsis, bilat hydronephrosis due to obstructing vaginal tumor, AKI.She presented with recurrent vaginal bleeding and irritation of vulvar area and perineum. She was noted to be febrile and UA revealed UTI with WBC. She has been admitted for GYN consult and XRT.    Assessment/Plan: 1. 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 Hemoglobin remained stable. 2. UTI, fever. Lactic acid normal, hemodynamics stable. No evidence of sepsis.  3. 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  4. HTN, stable, continue home medications  5. Diabetes mellitus type 2, stable 6. COPD, stable 7. Dementia, Parkinson's disease, schizoaffective schizophrenia. Appears stable. 8. Bilateral hydronephrosis and bladder outlet obstruction secondary to vaginal tumor. Continue Foley catheter.   Overall appears stable, await GYN recommendations   Change to po abx.   ADDENDUM Discussed with Dr. Glo Herring, I agree with his plans for transfer for further treatment. Patient remains clinically stable.  Code Status: Full DVT prophylaxis: SCDs Family Communication: No family bedside Disposition Plan: Pending GYN recommendations  Murray Hodgkins, MD  [facility]  Direct contact number: see [email or web address]; password [email or web address] If 7PM-7AM, please contact night-coverage at [email or web address] 05/04/2015, 7:18 AM  LOS: 1 day   Consultants:    Procedures:    Antibiotics:  Rocephin 3/19>>  HPI/Subjective: Denies any dysuria. Feels fine was able to eat without difficulty.   Objective: Filed Vitals:   05/04/15 0200 05/04/15 0300 05/04/15 0330 05/04/15 0644  BP: 108/96 110/64  127/49  Pulse:   66 134  Temp:    98.7 F (37.1 C)  TempSrc:    Oral   Resp:    18  Height:      Weight:      SpO2:   95% 91%    Intake/Output Summary (Last 24 hours) at 05/04/15 0718 Last data filed at 05/04/15 0645  Gross per 24 hour  Intake   1000 ml  Output    600 ml  Net    400 ml     Filed Weights   05/03/15 1719  Weight: 90.719 kg (200 lb)    Exam:   Afebrile, VSS, not hypoxic General:  Appears calm and comfortable. Rhythmic chewing movement Cardiovascular: RRR, no m/r/g. No LE edema. Respiratory: CTA bilaterally, no w/r/r. Normal respiratory effort. Psychiatric: grossly normal mood and affect, speech fluent. Oriented to self. She is not oriented to place and month  Neurologic: grossly non-focal.  New data reviewed:  Hgb stable 9.0  UA grossly positive.   Pending data:  UC   BC  Scheduled Meds: . amLODipine  5 mg Oral Daily  . cefTRIAXone (ROCEPHIN)  IV  1 g Intravenous Q24H  . cholecalciferol  400 Units Oral Daily  . ferrous sulfate  325 mg Oral QHS  . insulin aspart  0-5 Units Subcutaneous QHS  . insulin aspart  0-9 Units Subcutaneous TID WC  . lisinopril  40 mg Oral Daily  . risperiDONE  1 mg Oral QHS  . senna  1 tablet Oral Daily  . simvastatin  40 mg Oral QHS   Continuous Infusions: . sodium chloride 75 mL/hr at 05/04/15 0359    Active Problems:   Schizoaffective schizophrenia ([place])   Parkinson's disease ([place])   Vulvar irritation   UTI (lower urinary tract infection)   Vaginal bleeding  Primary vaginal cancer ([place])   Dementia   Vagina bleeding   Time spent 20 minutes     By signing my name below, I, Rennis Harding attest that this documentation has been prepared under the direction and in the presence of Murray Hodgkins, MD Electronically signed: Rennis Harding  05/04/2015 8:55am   I personally performed the services described in this documentation. All medical record entries made by the scribe were at my direction. I have reviewed the chart and agree that the record reflects my personal  performance and is accurate and complete. Murray Hodgkins, MD

## 2015-05-04 NOTE — Progress Notes (Signed)
The patients brother, mother and family came to the floor to see her and requested to know what was going on with her.  I voiced to them that due to the patient having a legal guardian I would have to notify her to ask if it would be okay to share information with them.  The verbalized understanding.  I notified Timoteo Ace the patients legal guardian of the families request and she stated that she would also like to be updated.  She stated that it would be okay to update the family.  I voiced to both parties that the patient was brought in due to increased bleeding from her vagina due to her vaginal cancer.  Dr. Glo Herring came in to see the patient and according to his note plans to have the patient transferred to [facility] or [facility] to figure out the plan of care that is going to be best for the patient.  He also says that there is a possibility with the care the patient is going to require that she may need a more skilled facility to return to rather than the ALF.  Mrs. Kenton Kingfisher and the family both verbalized understanding.  Mrs. Kenton Kingfisher also gave her permission for me to give the family her phone number.  I gave them her cell number and the direct number for here.

## 2015-05-04 NOTE — Consult Note (Signed)
Reason for Consult:recurrent minor vaginal bleeding, extensive vulvar excoriation s/p Vaginal/cervical squamous cell cancer Referring Physician: Hospitalist Service  Felicia Grant is an 70 y.o. female who is recently status post admission 04/15/2015 for bladder outlet obstruction due to this vaginal mass which has been biopsied and proven to be squamous cell cancer, with referral to GYN oncology and radiation therapy having good initiated. The patient had previously been scheduled for a consultation with GYN oncology today 05/03/2013 with Dr. Denman George at her office at the [facility], however she presented last night with bleeding which caused concern at her nursing home resulted in admission. She also has evidence of a UTI, culture pending from this admission. Culture from 04/30/2013 collected in my office from the indwelling catheter showed mixed urogenital flora with no dominant organism. Since admission she has not had significant bleeding, hemoglobin remains stable at 9.0 with a low white count 3000, not significantly changed from earlier this month. She looks actually quite good and has been switched to oral antibiotics for her UTI. My plan is to discuss the case with Dr. Denman George. I would like to see the patient transferred to [facility] until Dr. Denman George can see the patient and radiation therapy can make their assessments and initiate treatment plan, or possibly have her evaluation and treatment planned at [facility]  Currently the rather dramatic increase in vaginal discharge is resulted in significant perineal skin changes with multiple areas of tiny excoriation. Recommendations from wound care may be necessary given that she'll be starting radiation therapy soon. The patient's legal guardian is a court appointed guardian who lives in the [place] area.  Pertinent Gynecological History: Menses: post-menopausal Bleeding: light bleeding and heavy watery discharge per vagina,  not felt to be urine. Contraception:  DES exposure:  Blood transfusions: none Sexually transmitted diseases: no past history Previous GYN Procedures: unable to see the uterus on CT  Last mammogram:  Date:  Last pap:  Date:  OB History: G, P   Menstrual History: Menarche age:  No LMP recorded. Patient is postmenopausal.    Past Medical History  Diagnosis Date  . Dementia   . Bradycardia   . Hypertension   . Diabetes mellitus without complication ([place])   . Parkinson's disease ([place])   . Schizo affective schizophrenia ([place])   . COPD (chronic obstructive pulmonary disease) ([place])   . Cardiac pacemaker in situ   . 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 Acute blood loss anemia 04/15/2015    Secondary to bleeding vaginal mass.    chronic anemia  Past Surgical History  Procedure Laterality Date  . Hip surgery      No family history on file.  Social History:  reports that she has never smoked. She has quit using smokeless tobacco. She reports that she does not drink alcohol or use illicit drugs.  Her court appointed guardian is in the [place] area  Allergies: No Known Allergies  Medications: I have reviewed the patient's current medications.  ROS  Blood pressure 127/49, pulse 134, temperature 98.7 F (37.1 C), temperature source Oral, resp. rate 18, height 5' 4"  (1.626 m), weight 81.8 kg (180 lb 5.4 oz), SpO2 91 %. Physical Exam performed by Dr. Thurnell Garbe last night and copied here. My exam last Thursday in the office was essentially unchanged from t1730: Physical examination: Nursing notes reviewed; Vital signs and O2 SAT reviewed; Constitutional: Well developed, Well nourished, Well hydrated, In no acute distress; Head: Normocephalic, atraumatic; Eyes: EOMI, PERRL, No scleral icterus;  ENMT: Mouth and pharynx normal, Mucous membranes moist; Neck: Supple, Full range of motion, No lymphadenopathy; Cardiovascular: Regular rate and rhythm, No gallop; Respiratory: Breath sounds clear & equal  bilaterally, No wheezes. Speaking full sentences with ease, Normal respiratory effort/excursion; Chest: Nontender, Movement normal; Abdomen: Soft, Nontender, Nondistended, Normal bowel sounds; Genitourinary: No CVA tenderness. +foley draining clear yellow urine. Perineal exam performed with permission of pt and female ED RN assist during exam. External genitalia w/ multiple shallow tendernulcers. Vaginal vault currently with scant bleeding, +multiple dark blood clots on peripad.; Extremities: Pulses normal, No tenderness, No edema, No calf edema or asymmetry.; Neuro: Awake, alert, confused per hx dementia. Speech clear. Moves all extremities spontaneously and to command without apparent gross focal motor deficits.; Skin: Color normal, Warm, Dry.  his.   Results for orders placed or performed during the hospital encounter of 05/03/15 (from the past 48 hour(s))  Basic metabolic panel     Status: Abnormal   Collection Time: 05/03/15  5:52 PM  Result Value Ref Range   Sodium 140 135 - 145 mmol/L   Potassium 4.8 3.5 - 5.1 mmol/L   Chloride 106 101 - 111 mmol/L   CO2 27 22 - 32 mmol/L   Glucose, Bld 133 (H) 65 - 99 mg/dL   BUN 15 6 - 20 mg/dL   Creatinine, Ser 0.85 0.44 - 1.00 mg/dL   Calcium 7.9 (L) 8.9 - 10.3 mg/dL   GFR calc non Af Amer >60 >60 mL/min   GFR calc Af Amer >60 >60 mL/min    Comment: (NOTE) The eGFR has been calculated using the CKD EPI equation. This calculation has not been validated in all clinical situations. eGFR's persistently <60 mL/min signify possible Chronic Kidney Disease.    Anion gap 7 5 - 15  Lactic acid, plasma     Status: None   Collection Time: 05/03/15  5:52 PM  Result Value Ref Range   Lactic Acid, Venous 1.8 0.5 - 2.0 mmol/L  I-Stat CG4 Lactic Acid, ED     Status: None   Collection Time: 05/03/15  7:02 PM  Result Value Ref Range   Lactic Acid, Venous 1.54 0.5 - 2.0 mmol/L  CBC with Differential     Status: Abnormal   Collection Time: 05/03/15  7:15 PM   Result Value Ref Range   WBC 3.4 (L) 4.0 - 10.5 K/uL   RBC 4.02 3.87 - 5.11 MIL/uL   Hemoglobin 9.7 (L) 12.0 - 15.0 g/dL   HCT 32.6 (L) 36.0 - 46.0 %   MCV 81.1 78.0 - 100.0 fL   MCH 24.1 (L) 26.0 - 34.0 pg   MCHC 29.8 (L) 30.0 - 36.0 g/dL   RDW 18.8 (H) 11.5 - 15.5 %   Platelets 219 150 - 400 K/uL   Neutrophils Relative % 36 %   Neutro Abs 1.2 (L) 1.7 - 7.7 K/uL   Lymphocytes Relative 29 %   Lymphs Abs 1.0 0.7 - 4.0 K/uL   Monocytes Relative 29 %   Monocytes Absolute 1.0 0.1 - 1.0 K/uL   Eosinophils Relative 6 %   Eosinophils Absolute 0.2 0.0 - 0.7 K/uL   Basophils Relative 0 %   Basophils Absolute 0.0 0.0 - 0.1 K/uL  Urinalysis, Routine w reflex microscopic     Status: Abnormal   Collection Time: 05/03/15  7:41 PM  Result Value Ref Range   Color, Urine YELLOW YELLOW   APPearance TURBID (A) CLEAR   Specific Gravity, Urine 1.015 1.005 - 1.030  pH 6.0 5.0 - 8.0   Glucose, UA NEGATIVE NEGATIVE mg/dL   Hgb urine dipstick LARGE (A) NEGATIVE   Bilirubin Urine NEGATIVE NEGATIVE   Ketones, ur NEGATIVE NEGATIVE mg/dL   Protein, ur >300 (A) NEGATIVE mg/dL   Nitrite NEGATIVE NEGATIVE   Leukocytes, UA LARGE (A) NEGATIVE  Urine microscopic-add on     Status: Abnormal   Collection Time: 05/03/15  7:41 PM  Result Value Ref Range   Squamous Epithelial / LPF NONE SEEN NONE SEEN   WBC, UA TOO NUMEROUS TO COUNT 0 - 5 WBC/hpf   RBC / HPF TOO NUMEROUS TO COUNT 0 - 5 RBC/hpf   Bacteria, UA MANY (A) NONE SEEN  Culture, blood (routine x 2)     Status: None (Preliminary result)   Collection Time: 05/03/15  9:38 PM  Result Value Ref Range   Specimen Description BLOOD RIGHT HAND    Special Requests BOTTLES DRAWN AEROBIC AND ANAEROBIC [place]    Culture NO GROWTH < 12 HOURS    Report Status PENDING   Culture, blood (routine x 2)     Status: None (Preliminary result)   Collection Time: 05/03/15  9:43 PM  Result Value Ref Range   Specimen Description BLOOD RIGHT WRIST    Special Requests  BOTTLES DRAWN AEROBIC ONLY 8CC    Culture NO GROWTH < 12 HOURS    Report Status PENDING   CBC     Status: Abnormal   Collection Time: 05/04/15  5:00 AM  Result Value Ref Range   WBC 3.0 (L) 4.0 - 10.5 K/uL   RBC 3.81 (L) 3.87 - 5.11 MIL/uL   Hemoglobin 9.0 (L) 12.0 - 15.0 g/dL   HCT 31.5 (L) 36.0 - 46.0 %   MCV 82.7 78.0 - 100.0 fL   MCH 23.6 (L) 26.0 - 34.0 pg   MCHC 28.6 (L) 30.0 - 36.0 g/dL   RDW 18.5 (H) 11.5 - 15.5 %   Platelets 189 150 - 400 K/uL  Glucose, capillary     Status: Abnormal   Collection Time: 05/04/15  7:36 AM  Result Value Ref Range   Glucose-Capillary 124 (H) 65 - 99 mg/dL    No results found.  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 S/p bladder outlet obstruction by cancer mass, currently relieved by foley catheter 3  UTI. 4. Neutropenia , chronic 5. Anemia, acute on chronic.  Plan: Discussed with Dr. Denman George for probable transfer either to [facility] or [facility] for establishment of treatment plan and radiation oncology consultation and initiation of treatment.   Felicia Grant 05/04/2015

## 2015-05-05 DIAGNOSIS — D649 Anemia, unspecified: Secondary | ICD-10-CM

## 2015-05-05 DIAGNOSIS — N938 Other specified abnormal uterine and vaginal bleeding: Secondary | ICD-10-CM

## 2015-05-05 DIAGNOSIS — D709 Neutropenia, unspecified: Secondary | ICD-10-CM

## 2015-05-05 DIAGNOSIS — C52 Malignant neoplasm of vagina: Secondary | ICD-10-CM

## 2015-05-05 DIAGNOSIS — N9089 Other specified noninflammatory disorders of vulva and perineum: Secondary | ICD-10-CM

## 2015-05-05 LAB — GLUCOSE, CAPILLARY
GLUCOSE-CAPILLARY: 155 mg/dL — AB (ref 65–99)
GLUCOSE-CAPILLARY: 123 mg/dL — AB (ref 65–99)
GLUCOSE-CAPILLARY: 116 mg/dL — AB (ref 65–99)
Glucose-Capillary: 108 mg/dL — ABNORMAL HIGH (ref 65–99)

## 2015-05-05 LAB — URINE CULTURE: Culture: 3000

## 2015-05-05 MED ORDER — VANCOMYCIN HCL IN DEXTROSE 1-5 GM/200ML-% IV SOLN
1000.0000 mg | Freq: Two times a day (BID) | INTRAVENOUS | Status: DC
  Administered 2015-05-06 – 2015-05-10 (×8): 1000 mg via INTRAVENOUS
  Filled 2015-05-05 (×7): qty 200

## 2015-05-05 MED ORDER — VANCOMYCIN HCL 10 G IV SOLR
1500.0000 mg | Freq: Once | INTRAVENOUS | Status: AC
  Administered 2015-05-06: 1500 mg via INTRAVENOUS
  Filled 2015-05-05: qty 1500

## 2015-05-05 NOTE — Consult Note (Signed)
Reason for Consult: Vaginal cancer, with slight vaginal bleeding Referring Physician:  Hospitalist service  Felicia Grant is an 70 y.o. female.  She is known to me from prior admission earlier this month her vaginal bleeding at which time I consulted and performed a vaginal biopsy confirming squamous cell carcinoma extending down the right vaginal side wall to the introitus. That hospitalization was notable for outlet obstruction that required urology placement of a Foley catheter. Review of record shows that she grew out a UTI for Proteus mirabilis from urine culture drawn 04/15/2015. Currently she also is experiencing extensive vulvar excoriation from the vaginal discharge and has what appears to be intertrigo involving the abdominal pannus and a midline incision from previous surgeries, suspected to include a hysterectomy, as CT does not identify a uterine structure.  Pertinent Gynecological History: Menses: post-menopausal Bleeding: Vaginal bleeding from cancer Contraception:  DES exposure:  Blood transfusions: patient's had a baseline anemia with hemoglobin in the 9 range since initial admission  Sexually transmitted diseases: no past history Previous GYN Procedures: Probable hysterectomy, history is incomplete vaginal biopsy 04/20/2015 revealing invasive squamous cell carcinoma of the vaginal sidewall on the right Last mammogram: None on record Date:  Last pap: No known record Date:  OB History: G, P   Menstrual History: Menarche age:  No LMP recorded. Patient is postmenopausal.    Past Medical History  Diagnosis Date  . Dementia   . Bradycardia   . Hypertension   . Diabetes mellitus without complication ([place])   . Parkinson's disease ([place])   . Schizo affective schizophrenia ([place])   . COPD (chronic obstructive pulmonary disease) ([place])   . Cardiac pacemaker in situ   . 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 Acute blood loss anemia 04/15/2015    Secondary to bleeding vaginal mass.      Past Surgical History  Procedure Laterality Date  . Hip surgery      No family history on file.  Social History:  reports that she has never smoked. She has quit using smokeless tobacco. She reports that she does not drink alcohol or use illicit drugs.  Allergies: No Known Allergies  Medications: I have reviewed the patient's current medications.  ROS  Blood pressure 142/107, pulse 86, temperature 99.5 F (37.5 C), temperature source Oral, resp. rate 18, height 5' 4"  (1.626 m), weight 181 lb 3.5 oz (82.2 kg), SpO2 95 %. Physical Exam  Constitutional: She appears well-developed.  Elderly female pleasant [place] well and cooperates with care. Not considered to make her own decisions has a court-appointed guardian  Cardiovascular: Regular rhythm.   Respiratory: Effort normal.  GI: Soft.  Well-healed midline incision from previous surgeries, with irritation and moisture changes along the incision line currently improving compared to yesterday and exam in the office last week  Genitourinary:  Externa genitalia and inner thighs are extremely excoriated due to moisture trapping in the skin folds, improving with topical therapy with nystatin powder  and with the moisture originating from the vagina and the cancer. Foley catheter is in place patient is ambulatory and able to use the bathroom for bowel function The right vaginal sidewall is replaced by a hard and mobile mass extending the length of the vagina inside the introitus. She will need improved attention to moisture control with once or twice daily care with nystatin powder and  dry washcloth placement and replacement as needed to control moisture  Musculoskeletal: Normal range of motion.  Neurological: She is alert.  Skin: There is erythema.  The pelvic and genitourinary description   is noted above   Results for orders placed or performed during the hospital encounter of 05/03/15 (from the past 48 hour(s))  Basic metabolic  panel     Status: Abnormal   Collection Time: 05/03/15  5:52 PM  Result Value Ref Range   Sodium 140 135 - 145 mmol/L   Potassium 4.8 3.5 - 5.1 mmol/L   Chloride 106 101 - 111 mmol/L   CO2 27 22 - 32 mmol/L   Glucose, Bld 133 (H) 65 - 99 mg/dL   BUN 15 6 - 20 mg/dL   Creatinine, Ser 0.85 0.44 - 1.00 mg/dL   Calcium 7.9 (L) 8.9 - 10.3 mg/dL   GFR calc non Af Amer >60 >60 mL/min   GFR calc Af Amer >60 >60 mL/min    Comment: (NOTE) The eGFR has been calculated using the CKD EPI equation. This calculation has not been validated in all clinical situations. eGFR's persistently <60 mL/min signify possible Chronic Kidney Disease.    Anion gap 7 5 - 15  Lactic acid, plasma     Status: None   Collection Time: 05/03/15  5:52 PM  Result Value Ref Range   Lactic Acid, Venous 1.8 0.5 - 2.0 mmol/L  I-Stat CG4 Lactic Acid, ED     Status: None   Collection Time: 05/03/15  7:02 PM  Result Value Ref Range   Lactic Acid, Venous 1.54 0.5 - 2.0 mmol/L  CBC with Differential     Status: Abnormal   Collection Time: 05/03/15  7:15 PM  Result Value Ref Range   WBC 3.4 (L) 4.0 - 10.5 K/uL   RBC 4.02 3.87 - 5.11 MIL/uL   Hemoglobin 9.7 (L) 12.0 - 15.0 g/dL   HCT 32.6 (L) 36.0 - 46.0 %   MCV 81.1 78.0 - 100.0 fL   MCH 24.1 (L) 26.0 - 34.0 pg   MCHC 29.8 (L) 30.0 - 36.0 g/dL   RDW 18.8 (H) 11.5 - 15.5 %   Platelets 219 150 - 400 K/uL   Neutrophils Relative % 36 %   Neutro Abs 1.2 (L) 1.7 - 7.7 K/uL   Lymphocytes Relative 29 %   Lymphs Abs 1.0 0.7 - 4.0 K/uL   Monocytes Relative 29 %   Monocytes Absolute 1.0 0.1 - 1.0 K/uL   Eosinophils Relative 6 %   Eosinophils Absolute 0.2 0.0 - 0.7 K/uL   Basophils Relative 0 %   Basophils Absolute 0.0 0.0 - 0.1 K/uL  Urinalysis, Routine w reflex microscopic     Status: Abnormal   Collection Time: 05/03/15  7:41 PM  Result Value Ref Range   Color, Urine YELLOW YELLOW   APPearance TURBID (A) CLEAR   Specific Gravity, Urine 1.015 1.005 - 1.030   pH 6.0 5.0  - 8.0   Glucose, UA NEGATIVE NEGATIVE mg/dL   Hgb urine dipstick LARGE (A) NEGATIVE   Bilirubin Urine NEGATIVE NEGATIVE   Ketones, ur NEGATIVE NEGATIVE mg/dL   Protein, ur >300 (A) NEGATIVE mg/dL   Nitrite NEGATIVE NEGATIVE   Leukocytes, UA LARGE (A) NEGATIVE  Urine microscopic-add on     Status: Abnormal   Collection Time: 05/03/15  7:41 PM  Result Value Ref Range   Squamous Epithelial / LPF NONE SEEN NONE SEEN   WBC, UA TOO NUMEROUS TO COUNT 0 - 5 WBC/hpf   RBC / HPF TOO NUMEROUS TO COUNT 0 - 5 RBC/hpf   Bacteria, UA MANY (A) NONE SEEN  Culture, blood (  routine x 2)     Status: None (Preliminary result)   Collection Time: 05/03/15  9:38 PM  Result Value Ref Range   Specimen Description BLOOD RIGHT HAND    Special Requests BOTTLES DRAWN AEROBIC AND ANAEROBIC [place]    Culture NO GROWTH < 12 HOURS    Report Status PENDING   Culture, blood (routine x 2)     Status: None (Preliminary result)   Collection Time: 05/03/15  9:43 PM  Result Value Ref Range   Specimen Description BLOOD RIGHT WRIST    Special Requests BOTTLES DRAWN AEROBIC ONLY 8CC    Culture  Setup Time      GRAM POSITIVE COCCI IN CLUSTERS CALLED TO THOMAS,C ON 05/04/15 AT 2225 BY LOY,C PERFORMED AT [facility]    Culture NO GROWTH < 12 HOURS    Report Status PENDING   CBC     Status: Abnormal   Collection Time: 05/04/15  5:00 AM  Result Value Ref Range   WBC 3.0 (L) 4.0 - 10.5 K/uL   RBC 3.81 (L) 3.87 - 5.11 MIL/uL   Hemoglobin 9.0 (L) 12.0 - 15.0 g/dL   HCT 31.5 (L) 36.0 - 46.0 %   MCV 82.7 78.0 - 100.0 fL   MCH 23.6 (L) 26.0 - 34.0 pg   MCHC 28.6 (L) 30.0 - 36.0 g/dL   RDW 18.5 (H) 11.5 - 15.5 %   Platelets 189 150 - 400 K/uL  Glucose, capillary     Status: Abnormal   Collection Time: 05/04/15  7:36 AM  Result Value Ref Range   Glucose-Capillary 124 (H) 65 - 99 mg/dL  Glucose, capillary     Status: None   Collection Time: 05/04/15 12:11 PM  Result Value Ref Range   Glucose-Capillary 94 65 - 99 mg/dL  Glucose,  capillary     Status: Abnormal   Collection Time: 05/04/15  4:12 PM  Result Value Ref Range   Glucose-Capillary 108 (H) 65 - 99 mg/dL  Glucose, capillary     Status: Abnormal   Collection Time: 05/04/15  9:44 PM  Result Value Ref Range   Glucose-Capillary 147 (H) 65 - 99 mg/dL   Comment 1 Notify RN    Comment 2 Document in Chart   Glucose, capillary     Status: Abnormal   Collection Time: 05/05/15  7:41 AM  Result Value Ref Range   Glucose-Capillary 108 (H) 65 - 99 mg/dL   Comment 1 Notify RN    Comment 2 Document in Chart     No results found.  Assessment/Plan:   Pleasant elderly demented female with invasive squamous cell carcinoma originating from the vagina or cervix remnant, extending to the introitus and resulting in outlet obstruction now managed with Foley catheter. Proteus mirabilis UTI predated placement of the Foley catheter 2. Extensive vulvar excoriation due to chronic moisture changes have progressed since diagnosis of cancer, improving under better perineal care and topical nystatin 3 patient remains in need of planning of care for the cancer which will likely include palliative radiation therapy care. Currently her visit with GYN oncology has been rescheduled for 05/11/2015, currently at 9:30 in the morning with Dr. Charlaine Dalton [phone] at the [facility]. additionally I have spoken to [place]. At [phone], radiation oncologist who we'll try to coordinate appointments so that the patient could be seen by him next Monday as well as may require that the patient's a.m. appointment with Dr. Dossie Arbour is  changed to the afternoon. 4. Spoken  to Ms. Sol assisted living provider and try to get improved perineal care plus she states there I think is reasonable for her to stay at the assisted living facility for now,, but if perineal care does not improve she will need to be considered for SNF.  Jonnie Kind 05/05/2015

## 2015-05-05 NOTE — Clinical Documentation Improvement (Signed)
Family Medicine  A cause and effect relationship may not be assumed and must be documented by a provider.  Please clarify the relationship, if any, between Continue Foley catheter and UTI.  Are the conditions:   Due to or associated with each other  Unrelated to each other  Other  Clinically Undetermined  Please update your documentation within the medical record to reflect your response to this query. Thank you.  Supporting Information: (As per notes) Pt has chronic indwelling foley and UTI.  Please exercise your independent, professional judgment when responding. A specific answer is not anticipated or expected.  Thank You, Alessandra Grout, RN, BSN, CCDS,Clinical Documentation Specialist:  [phone]  [phone]=[facility]- Health Information Management

## 2015-05-05 NOTE — Progress Notes (Signed)
Pharmacy Antibiotic Note  Felicia Grant is a 70 y.o. female admitted on 05/03/2015 with bacteremia.  Pharmacy has been consulted for vancomycin dosing.  Plan: Vancomycin 1500 mg IV X 1 then 1gm IV q12 hours F/u renal function, cultures and clinical course  Height: 5\' 4"  (162.6 cm) Weight: 181 lb 3.5 oz (82.2 kg) IBW/kg (Calculated) : 54.7  Temp (24hrs), Avg:99.5 F (37.5 C), Min:98.5 F (36.9 C), Max:100.4 F (38 C)   Recent Labs Lab 05/03/15 1752 05/03/15 1902 05/03/15 1915 05/04/15 0500  WBC  --   --  3.4* 3.0*  CREATININE 0.85  --   --   --   LATICACIDVEN 1.8 1.54  --   --     Estimated Creatinine Clearance: 63 mL/min (by C-G formula based on Cr of 0.85).    No Known Allergies  Antimicrobials this admission: rocephin 3-19 >> 3-19 ceftin 3/20 >> 3/21  Microbiology results: 3/19 BCx: 1/2 GPC 3/19 UCx: insign growth  Thank you for allowing pharmacy to be a part of this patient's care.  Beverlee Nims 05/05/2015 3:41 PM

## 2015-05-05 NOTE — Care Management Obs Status (Signed)
[place] NOTIFICATION   Patient Details  Name: Daylia Hartin MRN: [id number] Date of Birth: [birth year]   Medicare Observation Status Notification Given:  Yes    Sherald Barge, RN 05/05/2015, 2:52 PM

## 2015-05-05 NOTE — Care Management Note (Signed)
Case Management Note  Patient Details  Name: Felicia Grant MRN: [id number] Date of Birth: 03/28/1945  Subjective/Objective:                  Pt admitted with UTI, bacteremia and vaginal bleeding. Pt is from ALF. Pt plans to return to ALF when discharged. CSW is aware of referral and will make arrangements for return to facility when appropriate.   Action/Plan: No CM needs at this time.   Expected Discharge Date:    05/09/2015              Expected Discharge Plan:  Assisted Living / Rest Home  In-House Referral:  Clinical Social Work  Discharge planning Services  CM Consult  Post Acute Care Choice:  NA Choice offered to:  NA  DME Arranged:    DME Agency:     HH Arranged:    HH Agency:     Status of Service:  In process, will continue to follow  Medicare Important Message Given:    Date Medicare IM Given:    Medicare IM give by:    Date Additional Medicare IM Given:    Additional Medicare Important Message give by:     If discussed at [place] of Stay Meetings, dates discussed:    Additional Comments:  Sherald Barge, RN 05/05/2015, 3:56 PM

## 2015-05-05 NOTE — NC FL2 (Signed)
[place] MEDICAID FL2 LEVEL OF CARE SCREENING TOOL     IDENTIFICATION  Patient Name: Felicia Grant Birthdate: 1945-09-16 Sex: female Admission Date (Current Location): 05/03/2015  [facility] and [place] Number:  [employer] and Address:  [place] [address], [place]      Provider Number: [id number]  Attending Physician Name and Address:  Samuella Cota, MD  Relative Name and Phone Number:  Harris,Vivian   Legal Guardian    [phone]        [phone]    Current Level of Care: Hospital Recommended Level of Care: [place] Prior Approval Number:    Date Approved/Denied:   PASRR Number:    Discharge Plan: Other (Comment) (return to ALF)    Current Diagnoses: Patient Active Problem List   Diagnosis Date Noted  . Vaginal bleeding 05/03/2015  . 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 Dementia 05/03/2015  . Vagina bleeding 05/03/2015  . Fever in adult   . Vaginal tumor   . Vaginal cancer ([place]) 05/01/2015  . Vulvar irritation 05/01/2015  . UTI (lower urinary tract infection) 05/01/2015  . Gynecologic malignancy ([place]) 04/18/2015  . Hydroureteronephrosis 04/16/2015  . Sepsis ([place]) 04/15/2015  . Schizoaffective schizophrenia ([place]) 04/15/2015  . COPD (chronic obstructive pulmonary disease) ([place]) 04/15/2015  . Parkinson's disease ([place]) 04/15/2015  . Cardiac pacemaker in situ 04/15/2015    Orientation RESPIRATION BLADDER Height & Weight     Self, Situation, Place  Normal Continent Weight: 181 lb 3.5 oz (82.2 kg) Height:  5\' 4"  (162.6 cm)  BEHAVIORAL SYMPTOMS/MOOD NEUROLOGICAL BOWEL NUTRITION STATUS  Other (Comment) (none)   Continent Diet (regular)  AMBULATORY STATUS COMMUNICATION OF NEEDS Skin   Supervision Verbally Skin abrasions, Other (Comment) (on sacral place washcloth between legs due to vaginal discharge as needed )                       Personal Care Assistance Level of Assistance   Bathing, Feeding, Dressing Bathing Assistance: Limited assistance Feeding assistance: Limited assistance Dressing Assistance: Limited assistance     Functional Limitations Info             SPECIAL CARE FACTORS FREQUENCY                       Contractures Contractures Info: Not present    Additional Factors Info  Code Status, Allergies, Psychotropic, Insulin Sliding Scale, Isolation Precautions Code Status Info: Full Code Allergies Info: NKA Psychotropic Info: Risperadol Insulin Sliding Scale Info: 3x daily with meals Isolation Precautions Info: None     Current Medications (05/05/2015):  This is the current hospital active medication list Current Facility-Administered Medications  Medication Dose Route Frequency Provider Last Rate Last Dose  . acetaminophen (TYLENOL) tablet 650 mg  650 mg Oral Q6H PRN Roney Jaffe, MD       Or  . acetaminophen (TYLENOL) suppository 650 mg  650 mg Rectal Q6H PRN Roney Jaffe, MD      . amLODipine (NORVASC) tablet 5 mg  5 mg Oral Daily Roney Jaffe, MD   5 mg at 05/05/15 0803  . cefUROXime (CEFTIN) tablet 500 mg  500 mg Oral BID WC Samuella Cota, MD   500 mg at 05/05/15 0803  . cholecalciferol (VITAMIN D) tablet 400 Units  400 Units Oral Daily Roney Jaffe, MD   400 Units at 05/05/15 0804  . ferrous sulfate tablet 325 mg  325 mg  Oral QHS Roney Jaffe, MD   325 mg at 05/04/15 2100  . insulin aspart (novoLOG) injection 0-5 Units  0-5 Units Subcutaneous QHS Roney Jaffe, MD   0 Units at 05/04/15 2206  . insulin aspart (novoLOG) injection 0-9 Units  0-9 Units Subcutaneous TID WC Roney Jaffe, MD   1 Units at 05/04/15 [phone]  . lisinopril (PRINIVIL,ZESTRIL) tablet 40 mg  40 mg Oral Daily Roney Jaffe, MD   40 mg at 05/05/15 0803  . nystatin (MYCOSTATIN/NYSTOP) topical powder   Topical BID Jonnie Kind, MD      . ondansetron [facility]) tablet 4 mg  4 mg Oral Q6H PRN Roney Jaffe, MD       Or  . ondansetron [facility])  injection 4 mg  4 mg Intravenous Q6H PRN Roney Jaffe, MD      . polyethylene glycol (MIRALAX / GLYCOLAX) packet 17 g  17 g Oral Daily PRN Roney Jaffe, MD      . risperiDONE (RISPERDAL) tablet 1 mg  1 mg Oral QHS Roney Jaffe, MD   1 mg at 05/04/15 2100  . senna (SENOKOT) tablet 8.6 mg  1 tablet Oral Daily Roney Jaffe, MD   8.6 mg at 05/05/15 0803  . simvastatin (ZOCOR) tablet 40 mg  40 mg Oral QHS Roney Jaffe, MD   40 mg at 05/04/15 2100     Discharge Medications: Please see discharge summary for a list of discharge medications.  Relevant Imaging Results:  Relevant Lab Results:   Additional Information    [place], LCSW

## 2015-05-05 NOTE — Progress Notes (Addendum)
PROGRESS NOTE  Felicia Grant [id number] DOB: 05/21/1945 DOA: 05/03/2015 PCP: Jani Gravel, MD  Summary: 34 yof with PMH of dementia, HTN, DM, and recent hospitalization March 1-7 for acute pyelonephritis/ Proteus UTI, sepsis, bilat hydronephrosis due to obstructing vaginal tumor, AKI.She presented with recurrent vaginal bleeding and irritation of vulvar area and perineum. She was noted to be febrile and UA revealed UTI with WBC. She has been admitted for GYN consult and XRT.    Assessment/Plan: 1. 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 Insignificant blood loss. Hemoglobin stable. 2. Extensive vulvar excoriation secondary to exudate from cancer, moisture changes. 3. Catheter associated UTI, fever. Catheter was present on admission. Afebrile since admission, greater than 24 hours. Lactic acid normal, hemodynamics stable. No evidence of sepsis.  4. Bacteremia GPC 1/2 bottles. Suspect contaminant. Spoke with lab, may be old to get culture results late today. 5. 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 Hgb stable.  6. HTN, stable, continue home medications  7. Diabetes mellitus type 2, stable 8. COPD, stable 9. Dementia, Parkinson's disease, schizoaffective schizophrenia. Appears stable. 10. Bilateral hydronephrosis and bladder outlet obstruction secondary to vaginal tumor. Continue Foley catheter.   Follow up with GYN oncology 05/11/2015, currently at 9:30 in the morning with Dr. Charlaine Dalton [phone] at the [facility] center..  Follow up with radiation oncology for possible Monday appointment.   Awaiting results from microbiology. All of the patient is a UTI and had fever on admission, sepsis/true bacteremia is not thought likely. Suspect contaminant. Can discharge once have final results of culture.Given her clinical improvement will just continue Ceftin for now.  Discussed with Dr. Glo Herring, stable for discharge. Needs  aggressive perineal care, nystatin, frequent changes of washcloths to keep area dry.  ADDENDUM Spoke with micro. Culture is still early in growth but it is coag positive and likely staph aureus. Discharged canceled. IV vancomycin. Follow-up final culture and sensitivities.  Code Status: Full DVT prophylaxis: SCDs Family Communication: No family bedside Disposition Plan: Discharge to [facility]   Murray Hodgkins, MD  [facility]  Direct contact number: see [email or web address]; password [email or web address] If 7PM-7AM, please contact night-coverage at [email or web address] 05/05/2015, 10:59 AM  LOS: 2 days   Consultants:  obgyn  Procedures:  None  Antibiotics:  Rocephin 3/19>>  HPI/Subjective: Denies any pain, trouble breathing or difficulty eating.   Objective: Filed Vitals:   05/04/15 1411 05/04/15 2140 05/05/15 0708 05/05/15 0803  BP: 129/80 137/56 110/62 142/107  Pulse: 73 69 70 86  Temp: 99.6 F (37.6 C) 100.4 F (38 C) 99.5 F (37.5 C)   TempSrc: Oral Oral Oral   Resp: 16 20 18    Height:      Weight:   82.2 kg (181 lb 3.5 oz)   SpO2: 100% 94% 95%     Intake/Output Summary (Last 24 hours) at 05/05/15 1059 Last data filed at 05/05/15 0900  Gross per 24 hour  Intake   1860 ml  Output   1350 ml  Net    510 ml     Filed Weights   05/03/15 1719 05/04/15 0644 05/05/15 0708  Weight: 90.719 kg (200 lb) 81.8 kg (180 lb 5.4 oz) 82.2 kg (181 lb 3.5 oz)    Exam:  Afebrile, VSS, not hypoxic General:  Appears calm and comfortable. Rhythmic chewing movement Cardiovascular: RRR, no m/r/g. No LE edema. Respiratory: CTA bilaterally, no w/r/r. Normal respiratory effort.    Pending data:  UC   BC  Scheduled Meds: .  amLODipine  5 mg Oral Daily  . cefUROXime  500 mg Oral BID WC  . cholecalciferol  400 Units Oral Daily  . ferrous sulfate  325 mg Oral QHS  . insulin aspart  0-5 Units Subcutaneous QHS  . insulin aspart  0-9 Units Subcutaneous TID WC  . lisinopril  40 mg Oral Daily    . nystatin   Topical BID  . risperiDONE  1 mg Oral QHS  . senna  1 tablet Oral Daily  . simvastatin  40 mg Oral QHS   Continuous Infusions:    Active Problems:   Schizoaffective schizophrenia ([place])   Parkinson's disease (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 All medical record entries made by the scribe were at my direction. I have reviewed the chart and agree that the record reflects my personal performance and is accurate and complete. Murray Hodgkins, MD

## 2015-05-05 NOTE — Progress Notes (Addendum)
Author: Rigoberto Noel, LCSW Service: Clinical Social Work Author Type: Social Worker    Filed: 04/17/2015 1:50 PM Note Time: 04/17/2015 1:46 PM Status: Signed   Editor: Rigoberto Noel, LCSW (Social Worker)     Expand All Collapse All   Clinical Social Work Assessment  Patient Details  Name: Felicia Grant MRN: [id number] Date of Birth: 01-07-1946  Date of referral: 04/17/15   Reason for consult: Discharge Planning, Facility Placement     Permission sought to share information with: Facility Sport and exercise psychologist, Guardian Permission granted to share information:: Yes, Verbal Permission Granted Name:: Timoteo Ace Agency:: [facility] Relationship::   Contact Information:    Housing/Transportation Living arrangements for the past 2 months: [place] Information: Guardian Timoteo Ace: [phone]) Patient Interpreter Needed: None Criminal Activity/Legal Involvement Pertinent to Current Situation/Hospitalization: No - Comment as needed Significant Relationships:   Lives with: Facility Resident Do you feel safe going back to the place where you live? Yes Need for family participation in patient care: Yes (Comment)  Care giving concerns: The patient's guardian plans for the patient to return to [facility] ALF at discharge.   Social Worker assessment / plan: CSW spoke with the patient's guardian Timoteo Ace to complete assessment. Adonis Huguenin states that she is the owner of [employer] which contracts with the state to provide guardianship services. Per Adonis Huguenin, the patient is a resident of [facility] and [place] for the patient to return to [facility] at discharge. CSW explained CSW role and process of getting the patient back to the facility. CSW will continue to follow.  Employment status: Disabled (Comment on whether or not currently receiving  Disability) Insurance information: Medicare, Medicaid In [place] PT Recommendations: Not assessed at this time Information / Referral to community resources: Other (Comment Required) (CSW will send patient's information to [facility].)  Patient/Family's Response to care: The patient's guardian states that she is happy with the care the patient has received.  Patient/Family's Understanding of and Emotional Response to Diagnosis, Current Treatment, and Prognosis: The patient appears to have very limited insight at this time. Adonis Huguenin appears to have a good understanding of the reason for the patient's admission and post DC needs.   Emotional Assessment Appearance: Appears stated age Attitude/Demeanor/Rapport: Unable to Assess Affect (typically observed): Unable to Assess Orientation:   Alcohol / Substance use: Never Used Psych involvement (Current and /or in the community): No (Comment)  Discharge Needs  Concerns to be addressed: Discharge Planning Concerns Readmission within the last 30 days: No Current discharge risk: Chronically ill, Cognitively Impaired Barriers to Discharge: Continued Medical Work up   [place], LCSW 04/17/2015, 1:46 PM       No changes in psychosocial assessment from most recent admission last week. Patient's guardian is involved and patient to transfer back to ALF. Possible dc today per MD, but her labs showed infection and pending plan, patient may hold until tomorrow. She will return to ALF at DC when medically stable.  Plan: Remain in hospital due to infection. Patient starting on Vanc. Will return to ALF once medically stable. If patient needs long term antibiotics, higher level of care may be warranted.  Will continue to follow acutely.  Lane Hacker, MSW

## 2015-05-06 ENCOUNTER — Observation Stay (HOSPITAL_COMMUNITY): Payer: Medicare Other

## 2015-05-06 DIAGNOSIS — R7881 Bacteremia: Secondary | ICD-10-CM | POA: Diagnosis present

## 2015-05-06 DIAGNOSIS — E119 Type 2 diabetes mellitus without complications: Secondary | ICD-10-CM

## 2015-05-06 DIAGNOSIS — R001 Bradycardia, unspecified: Secondary | ICD-10-CM | POA: Diagnosis present

## 2015-05-06 LAB — GLUCOSE, CAPILLARY
GLUCOSE-CAPILLARY: 100 mg/dL — AB (ref 65–99)
GLUCOSE-CAPILLARY: 150 mg/dL — AB (ref 65–99)
GLUCOSE-CAPILLARY: 143 mg/dL — AB (ref 65–99)
Glucose-Capillary: 165 mg/dL — ABNORMAL HIGH (ref 65–99)

## 2015-05-06 MED ORDER — CEFAZOLIN SODIUM-DEXTROSE 2-4 GM/100ML-% IV SOLN
2.0000 g | Freq: Three times a day (TID) | INTRAVENOUS | Status: DC
  Administered 2015-05-06 – 2015-05-07 (×3): 2 g via INTRAVENOUS
  Filled 2015-05-06 (×7): qty 100

## 2015-05-06 MED ORDER — CEFAZOLIN SODIUM-DEXTROSE 2-4 GM/100ML-% IV SOLN
INTRAVENOUS | Status: AC
  Filled 2015-05-06: qty 100

## 2015-05-06 NOTE — Progress Notes (Signed)
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 -Has follow-up arranged with GYN and oncology GYN.  Catheter associated UTI -On 3/1 she had a Proteus UTI, culture from this admission remains negative. -No evidence for sepsis.  Staph aureus bacteremia -1 out of 2 cultures has turned positive.  - has been seen by infectious diseases, agree with IV vancomycin and Ancef pending final susceptibilities.  -2-D echo has been ordered. -Because she has a pacemaker, will need TEE if 2-D echo negative. -Agree that we should have a palliative care consultation to further delineate goals of care given her advanced age, aggressive vaginal tumor and bacteremia. Will request today.  Bilateral hydronephrosis and bladder outlet obstruction -Secondary to vaginal tumor. -Continue Foley catheter for drainage.  Type 2 diabetes -Well controlled, continue current regimen  COPD -Stable, not exacerbated  Hypertension -Stable, continue home medications   Code Status: Full code Family Communication: patient only  Disposition Plan: to be determined   Consultants:  GYN  ID   Antibiotics:  Vancomycin  Ancef   Subjective: Feels well, no complaints  Objective: Filed Vitals:   05/05/15 0803 05/05/15 1348 05/05/15 2125 05/06/15 0618  BP: 142/107 134/59 124/71 142/74  Pulse: 86 68 85 72  Temp:  98.5 F (36.9 C) 100.1 F (37.8 C) 99.3 F (37.4 C)  TempSrc:  Oral Oral Oral  Resp:  18 20 18   Height:      Weight:    80.2 kg (176 lb 12.9 oz)  SpO2:  100% 92% 90%    Intake/Output Summary (Last 24 hours) at 05/06/15 1507 Last data filed at 05/06/15 1239  Gross per 24 hour  Intake    720 ml  Output   2950 ml  Net  -2230 ml   Filed Weights   05/04/15 0644 05/05/15 0708 05/06/15 0618  Weight: 81.8 kg (180 lb 5.4 oz)  82.2 kg (181 lb 3.5 oz) 80.2 kg (176 lb 12.9 oz)    Exam:   General:  AA Ox3  Cardiovascular: RRR  Respiratory: CTA B  Abdomen: S/NT/ND/+BS  Extremities: no C/C/E   Neurologic:  Non-focal  Data Reviewed: Basic Metabolic Panel:  Recent Labs Lab 05/03/15 1752  NA 140  K 4.8  CL 106  CO2 27  GLUCOSE 133*  BUN 15  CREATININE 0.85  CALCIUM 7.9*   Liver Function Tests: No results for input(s): AST, ALT, ALKPHOS, BILITOT, PROT, ALBUMIN in the last 168 hours. No results for input(s): LIPASE, AMYLASE in the last 168 hours. No results for input(s): AMMONIA in the last 168 hours. CBC:  Recent Labs Lab 05/03/15 1915 05/04/15 0500  WBC 3.4* 3.0*  NEUTROABS 1.2*  --   HGB 9.7* 9.0*  HCT 32.6* 31.5*  MCV 81.1 82.7  PLT 219 189   Cardiac Enzymes: No results for input(s): CKTOTAL, CKMB, CKMBINDEX, TROPONINI in the last 168 hours. BNP (last 3 results) No results for input(s): BNP in the last 8760 hours.  ProBNP (last 3 results) No results for input(s): PROBNP in the last 8760 hours.  CBG:  Recent Labs Lab 05/05/15 1053 05/05/15 1708 05/05/15 2111 05/06/15 0752 05/06/15 1117  GLUCAP 155* 123* 116* 100* 150*    Recent Results (from the past 240 hour(s))  Urine culture     Status: None   Collection Time: 05/01/15 11:00 AM  Result Value Ref Range Status  Urine Culture, Routine Final report  Final   Urine Culture result 1 Comment  Final    Comment: Mixed urogenital flora 10,000-25,000 colony forming units per mL   Urine culture     Status: None   Collection Time: 05/03/15  7:41 PM  Result Value Ref Range Status   Specimen Description URINE, CLEAN CATCH  Final   Special Requests NONE  Final   Culture   Final    3,000 COLONIES/mL INSIGNIFICANT GROWTH Performed at [facility]    Report Status 05/05/2015 FINAL  Final  Culture, blood (routine x 2)     Status: None (Preliminary result)   Collection Time: 05/03/15  9:38 PM  Result Value Ref Range  Status   Specimen Description BLOOD RIGHT HAND  Final   Special Requests BOTTLES DRAWN AEROBIC AND ANAEROBIC [place]  Final   Culture NO GROWTH 3 DAYS  Final   Report Status PENDING  Incomplete  Culture, blood (routine x 2)     Status: None (Preliminary result)   Collection Time: 05/03/15  9:43 PM  Result Value Ref Range Status   Specimen Description BLOOD RIGHT WRIST  Final   Special Requests BOTTLES DRAWN AEROBIC ONLY 8CC  Final   Culture  Setup Time   Final    GRAM POSITIVE COCCI IN CLUSTERS RECOVERED FROM THE AEROBIC BOTTLE CALLED TO [place] ON 05/04/15 AT 2225 BY LOY,C Performed at [facility]    Culture   Final    STAPHYLOCOCCUS AUREUS SUSCEPTIBILITIES TO FOLLOW Performed at [facility]    Report Status PENDING  Incomplete     Studies: No results found.  Scheduled Meds: . amLODipine  5 mg Oral Daily  .  ceFAZolin (ANCEF) IV  2 g Intravenous 3 times per day  . cholecalciferol  400 Units Oral Daily  . ferrous sulfate  325 mg Oral QHS  . insulin aspart  0-5 Units Subcutaneous QHS  . insulin aspart  0-9 Units Subcutaneous TID WC  . lisinopril  40 mg Oral Daily  . nystatin   Topical BID  . risperiDONE  1 mg Oral QHS  . senna  1 tablet Oral Daily  . simvastatin  40 mg Oral QHS  . vancomycin  1,000 mg Intravenous Q12H   Continuous Infusions:   Principal Problem:   Staphylococcus aureus bacteremia Active Problems:   Schizoaffective schizophrenia (HCC)   COPD (chronic obstructive pulmonary disease) (HCC)   Parkinson's disease (HCC)   Cardiac pacemaker in situ   Hydroureteronephrosis   Vulvar irritation   UTI (lower urinary tract infection)   Primary vaginal cancer (HCC)   Dementia   Vagina bleeding   Diabetes mellitus (HCC)   Bradycardia    Time spent: 25 minutes. Greater than 50% of this time was spent in direct contact with the patient coordinating care.    Lelon Frohlich  [facility] Pager [phone]  If 7PM-7AM, please  contact night-coverage at [email or web address], password [facility] 05/06/2015, 3:07 PM  LOS: 3 days

## 2015-05-06 NOTE — Consult Note (Signed)
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 amLODipine  5 mg Oral Daily  . cholecalciferol  400 Units Oral Daily  . ferrous sulfate  325 mg Oral QHS  . insulin aspart  0-5 Units Subcutaneous QHS  . insulin aspart  0-9 Units Subcutaneous TID WC  . lisinopril  40 mg Oral Daily  . nystatin   Topical BID  . risperiDONE  1 mg Oral QHS  . senna  1 tablet Oral Daily  . simvastatin  40 mg Oral QHS  . vancomycin  1,000 mg Intravenous Q12H    Recommendations: 1. Continue vancomycin and start cefazolin pending antibiotic susceptibilities 2. Repeat blood cultures 3. Transthoracic echocardiogram 4. Consider Palliative Care Consult   Assessment: Ms. Sherron has at the very least, transient staph aureus bacteremia. I will continue vancomycin and start cefazolin pending antibiotic susceptibilities results to give optimal coverage for MRSA and MSSA. I will repeat blood cultures and order a transthoracic echocardiogram. She has a permanent pacemaker put her at relatively high risk for device infection and native valve endocarditis. Given her dementia and recently discovered vaginal tumor her prognosis is likely to be relatively poor. I would strongly consider a palliative care consult and discuss goals of care and management with her guardian before making decisions about transesophageal  echocardiography.   HPI: Felicia Grant is a 70 y.o. female with dementia and multiple medical problems who was admitted on 04/15/2015 with sepsis. She was found to have a Proteus UTI and bilateral hydronephrosis related to a newly discovered vaginal mass. A Foley catheter was placed and she was treated with IV ceftriaxone. The mass was biopsied revealing invasive squamous cell carcinoma. She was discharged to complete several more days of cefuroxime therapy. She developed vaginal bleeding and fever and was readmitted on 05/03/2015 with a temperature of 101.1. One of 2 admission blood cultures growing staph aureus. She has defervesced.   Review of Systems: Review of Systems  Unable to perform ROS Constitutional:       Review of systems not performed because this is a remote consultation.    Past Medical History  Diagnosis Date  . Dementia   . Bradycardia   . Hypertension   . Diabetes mellitus without complication ([place])   . Parkinson's disease ([place])   . Schizo affective schizophrenia ([place])   . COPD (chronic obstructive pulmonary disease) ([place])   . Cardiac pacemaker in situ   . 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 Acute blood loss anemia 04/15/2015    Secondary to bleeding vaginal mass.     Social History  Substance Use Topics  . Smoking status: Never Smoker   . Smokeless tobacco: Former Systems developer  . Alcohol Use: No    No family history on file. No Known Allergies  OBJECTIVE: Blood pressure 142/74, pulse 72, temperature 99.3 F (37.4 C), temperature source Oral, resp. rate 18, height 5\' 4"  (1.626 m), weight 176 lb 12.9 oz (80.2 kg), SpO2 90 %.  Physical Exam  Constitutional:  Physical exam not performed because this is a remote consultation.    Lab Results Lab Results  Component Value Date   WBC 3.0* 05/04/2015   HGB 9.0* 05/04/2015   HCT 31.5* 05/04/2015   MCV 82.7 05/04/2015   PLT 189 05/04/2015    Lab Results  Component Value Date   CREATININE 0.85 05/03/2015   BUN 15  05/03/2015   NA 140 05/03/2015   K 4.8 05/03/2015   CL 106 05/03/2015   CO2 27 05/03/2015    Lab Results  Component Value Date   ALT 12* 04/16/2015   AST 22 04/16/2015   ALKPHOS 61 04/16/2015   BILITOT 0.6 04/16/2015     Microbiology: Recent Results (from the past 240 hour(s))  Urine culture     Status: None   Collection Time: 05/01/15 11:00 AM  Result Value Ref Range Status   Urine Culture, Routine Final report  Final   Urine Culture result 1 Comment  Final    Comment: Mixed urogenital flora 10,000-25,000 colony forming units per mL   Urine culture     Status: None   Collection Time: 05/03/15  7:41 PM  Result Value Ref Range Status   Specimen Description URINE, CLEAN CATCH  Final   Special Requests NONE  Final   Culture   Final    3,000 COLONIES/mL INSIGNIFICANT GROWTH Performed at [facility]    Report Status 05/05/2015 FINAL  Final  Culture, blood (routine x 2)     Status: None (Preliminary result)   Collection Time: 05/03/15  9:38 PM  Result Value Ref Range Status   Specimen Description BLOOD RIGHT HAND  Final   Special Requests BOTTLES DRAWN AEROBIC AND ANAEROBIC [place]  Final   Culture NO GROWTH 3 DAYS  Final   Report Status PENDING  Incomplete  Culture, blood (routine x 2)     Status: None (Preliminary result)   Collection Time: 05/03/15  9:43 PM  Result Value Ref Range Status   Specimen Description BLOOD RIGHT WRIST  Final   Special Requests BOTTLES DRAWN AEROBIC ONLY 8CC  Final   Culture  Setup Time   Final    GRAM POSITIVE COCCI IN CLUSTERS RECOVERED FROM THE AEROBIC BOTTLE CALLED TO [place] ON 05/04/15 AT 2225 BY LOY,C Performed at [facility]    Culture   Final    [place] Performed at [facility]    Report Status PENDING  Incomplete    Michel Bickers, MD [place] for [place] [phone] pager   [phone] cell 05/06/2015, 12:05 PM

## 2015-05-07 ENCOUNTER — Encounter (HOSPITAL_COMMUNITY): Payer: Self-pay | Admitting: Primary Care

## 2015-05-07 DIAGNOSIS — R7881 Bacteremia: Secondary | ICD-10-CM

## 2015-05-07 DIAGNOSIS — B9561 Methicillin susceptible Staphylococcus aureus infection as the cause of diseases classified elsewhere: Secondary | ICD-10-CM

## 2015-05-07 DIAGNOSIS — Z950 Presence of cardiac pacemaker: Secondary | ICD-10-CM

## 2015-05-07 DIAGNOSIS — Z7189 Other specified counseling: Secondary | ICD-10-CM | POA: Insufficient documentation

## 2015-05-07 DIAGNOSIS — Z515 Encounter for palliative care: Secondary | ICD-10-CM | POA: Insufficient documentation

## 2015-05-07 DIAGNOSIS — G20 Parkinson's disease: Secondary | ICD-10-CM

## 2015-05-07 DIAGNOSIS — F0390 Unspecified dementia without behavioral disturbance: Secondary | ICD-10-CM

## 2015-05-07 LAB — BASIC METABOLIC PANEL
ANION GAP: 6 (ref 5–15)
BUN: 5 mg/dL — ABNORMAL LOW (ref 6–20)
CHLORIDE: 104 mmol/L (ref 101–111)
CO2: 27 mmol/L (ref 22–32)
Calcium: 7.6 mg/dL — ABNORMAL LOW (ref 8.9–10.3)
Creatinine, Ser: 0.59 mg/dL (ref 0.44–1.00)
GFR calc Af Amer: >60 mL/min (ref >60)
Glucose, Bld: 181 mg/dL — ABNORMAL HIGH (ref 65–99)
POTASSIUM: 3.6 mmol/L (ref 3.5–5.1)
SODIUM: 137 mmol/L (ref 135–145)

## 2015-05-07 LAB — CBC
HEMATOCRIT: 33.2 % — AB (ref 36.0–46.0)
HEMOGLOBIN: 10.2 g/dL — AB (ref 12.0–15.0)
MCH: 24.1 pg — ABNORMAL LOW (ref 26.0–34.0)
MCHC: 30.7 g/dL (ref 30.0–36.0)
MCV: 78.5 fL (ref 78.0–100.0)
Platelets: 241 10*3/uL (ref 150–400)
RBC: 4.23 MIL/uL (ref 3.87–5.11)
RDW: 17.8 % — ABNORMAL HIGH (ref 11.5–15.5)
WBC: 3.2 10*3/uL — AB (ref 4.0–10.5)

## 2015-05-07 LAB — GLUCOSE, CAPILLARY
GLUCOSE-CAPILLARY: 174 mg/dL — AB (ref 65–99)
GLUCOSE-CAPILLARY: 171 mg/dL — AB (ref 65–99)
GLUCOSE-CAPILLARY: 172 mg/dL — AB (ref 65–99)
Glucose-Capillary: 154 mg/dL — ABNORMAL HIGH (ref 65–99)

## 2015-05-07 LAB — ECHOCARDIOGRAM COMPLETE
Height: 64 in
WEIGHTICAEL: 2828.94 [oz_av]

## 2015-05-07 NOTE — Consult Note (Signed)
Consultation Note Date: 05/07/2015   Patient Name: Felicia Grant  DOB: 1945-05-28  MRN: [id number]  Age / Sex: 70 y.o., female  PCP: Jani Gravel, MD Referring Physician: [place]*  Reason for Consultation: Establishing goals of care and Psychosocial/spiritual support    Clinical Assessment/Narrative: Felicia Grant is a 70 year old female with a past medical history of schizophrenia, Parkinson's disease, dementia, hypertension, NIDDM, and pacemaker.  She was admitted March 1-7 of 2017 with acute pyelonephritis, Proteus UTI, sepsis, and bilateral hydronephrosis due to obstructing vaginal tumor. She was returned to her ALF, [facility], but has continued vaginal bleeding. She is readmitted on 3/19 for recurrent vaginal bleeding and irritation of her vulvular area and perineum. OB/GYN, Dr. Glo Herring, recommended admission to facilitate palliative radiation therapy for her vaginal malignancy.  She was found to have MRSA positive blood cultures, infectious diseases been consulted, with recommendations of broadening scope of antibiotics, TEE, repeat blood cultures, and palliative consult.   Felicia Grant is lying quietly in bed looking out the window with the TV on. She greets me as I enter, and makes but does not keep eye contact.  She tells me her name is "Evanita" (correct) , but she is unable to tell me where we are. She says that she has lots of children, 24 to be exact (in fact she has none). I ask her sister's name and she tells me Ignacio, which is her name.  She denies pain, but tells me that she's cold so I cover her with a blanket.  Call to [facility] social worker Timoteo Ace.   Adonis Huguenin shares that she understands that Felicia Grant will now likely need to be placed in a skilled nursing facility, not be able to return to her ALF.   We talk about Felicia Grant's current condition with her vaginal cancer  and suggested treatment of palliative radiation (not curative).   We also talk about Felicia Grant's blood cultures on the 19th returning MRSA.  We talk about the difficulties with treating MRSA in the blood with a pacemaker.  I share that it is recommended that the pacemaker be removed, the patient treated for 6 weeks with IV antibiotics, and then a return of the pacemaker.  We discuss the hardware of the pacemaker holding bacteria even with IV antibiotics.   I share that this treatment plan would likely be difficult for Felicia Grant.    Adonis Huguenin shares that she is been through this type of situation with other wards. She states that knowing Shanya's state of mind, "she doesn't like change", that she agrees that this treatment plan will be difficult.   Adonis Huguenin also states that she "would be surprised if [name] lets you treat her".    Adonis Huguenin states that she does not want to prolong her life if this is all she has.   She states that Felicia Grant had not left her former residence for about 10 years before being transferred to [facility] just one month ago.  Adonis Huguenin shares that she has no questions at this time. I encouraged her to think about what we discussed and we will continue to talk with her about a treatment plan. I share that it is not unreasonable for Felicia Grant to take palliative radiation IF she is able.  I give Adonis Huguenin my contact information and encourage her to call with questions or concerns, but that I will call her tomorrow to update her and continue the discussion about treatment options.   Contacts/Participants  in Discussion:  [facility] social worker, Timoteo Ace  [phone] Primary Decision Maker: [facility] social worker, Timoteo Ace   Relationship to Patient [facility] social worker, Timoteo Ace HCPOA: yes   SUMMARY OF RECOMMENDATIONS  Code Status/Advance Care Planning: DNR - Adonis Huguenin agrees today to allow a natural death for Felicia Grant.  She shares that she does not have the same  legal constraints as the DSS social worker and is able to make this independent decision for Felicia Grant.    Code Status Orders        Start     Ordered   05/07/15 1213  Do not attempt resuscitation (DNR)   Continuous    Question Answer Comment  In the event of cardiac or respiratory ARREST Do not call a "code blue"   In the event of cardiac or respiratory ARREST Do not perform Intubation, CPR, defibrillation or ACLS   In the event of cardiac or respiratory ARREST Use medication by any route, position, wound care, and other measures to relive pain and suffering. May use oxygen, suction and manual treatment of airway obstruction as needed for comfort.      05/07/15 1213    Code Status History    Date Active Date Inactive Code Status Order ID Comments User Context   05/04/2015  4:32 AM 05/07/2015 12:13 PM Full Code [id number]  Roney Jaffe, MD Inpatient   04/15/2015  6:19 PM 04/21/2015  7:39 PM Full Code [id number]  Rexene Alberts, MD ED      Other Directives:None  Symptom Management:   per hospitalist  Palliative Prophylaxis:   Frequent Pain Assessment, Palliative Wound Care and Turn Reposition  Additional Recommendations (Limitations, Scope, Preferences):  Treat the treatable at this point, considering palliative radiation for vaginal carcinoma, considering the use of IV antibiotics and removal and then reinsertion of pacemaker.  Psycho-social/Spiritual:  Support System: Adequate Desire for further Chaplaincy support: not discussed today Additional Recommendations: None at this time  Prognosis: Unable to determine, based on outcomes. In the decision related to pacemaker removal, IV antibiotic treatment, and pacemaker reinsertion ( due to bacteremia).   Discharge Planning: Based on outcomes, likely placement in residential SNF.   Chief Complaint/ Primary Diagnoses: Present on Admission:  . Primary vaginal cancer ([place]) . UTI (lower urinary tract infection) . Parkinson's  disease ([place]) . Schizoaffective schizophrenia ([place]) . Dementia . Vagina bleeding . Staphylococcus aureus bacteremia . Cardiac pacemaker in situ . COPD (chronic obstructive pulmonary disease) ([place]) . Bradycardia . Hydroureteronephrosis . Bacteremia  I have reviewed the medical record, interviewed the patient and family, and examined the patient. The following aspects are pertinent.  Past Medical History  Diagnosis Date  . Dementia   . Bradycardia   . Hypertension   . Diabetes mellitus without complication ([place])   . Parkinson's disease ([place])   . Schizo affective schizophrenia ([place])   . COPD (chronic obstructive pulmonary disease) ([place])   . Cardiac pacemaker in situ   . 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 Acute blood loss anemia 04/15/2015    Secondary to bleeding vaginal mass.    Social History   Social History  . Marital Status: Single    Spouse Name: N/A  . Number of Children: N/A  . Years of Education: N/A   Social History Main Topics  . Smoking status: Never Smoker   . Smokeless tobacco: Former Systems developer  . Alcohol Use: No  . Drug Use: No  . Sexual Activity: Not Currently    Birth Control/  Protection: Post-menopausal   Other Topics Concern  . None   Social History Narrative   History reviewed. No pertinent family history. Scheduled Meds: . amLODipine  5 mg Oral Daily  . cholecalciferol  400 Units Oral Daily  . ferrous sulfate  325 mg Oral QHS  . insulin aspart  0-5 Units Subcutaneous QHS  . insulin aspart  0-9 Units Subcutaneous TID WC  . lisinopril  40 mg Oral Daily  . nystatin   Topical BID  . risperiDONE  1 mg Oral QHS  . senna  1 tablet Oral Daily  . simvastatin  40 mg Oral QHS  . vancomycin  1,000 mg Intravenous Q12H   Continuous Infusions:  PRN Meds:.acetaminophen **OR** acetaminophen, ondansetron **OR** ondansetron (ZOFRAN) IV, polyethylene glycol Medications Prior to Admission:  Prior to Admission medications   Medication Sig Start Date End Date Taking?  Authorizing Provider  amLODipine (NORVASC) 5 MG tablet Take 5 mg by mouth daily.   Yes Historical Provider, MD  cetirizine (ZYRTEC) 10 MG tablet Take 10 mg by mouth daily.   Yes Historical Provider, MD  cholecalciferol (VITAMIN D) 400 units TABS tablet Take 400 Units by mouth daily.   Yes Historical Provider, MD  ferrous sulfate 325 (65 FE) MG tablet Take 325 mg by mouth at bedtime.   Yes Historical Provider, MD  fluconazole (DIFLUCAN) 150 MG tablet Take 150 mg by mouth daily. For 5 days stop 05/05/15   Yes Historical Provider, MD  guaifenesin (COUGH SYRUP) 100 MG/5ML syrup Take 200 mg by mouth every 4 (four) hours as needed for cough.   Yes Historical Provider, MD  guaiFENesin (MUCINEX) 600 MG 12 hr tablet Take 600 mg by mouth at bedtime as needed for cough.   Yes Historical Provider, MD  lisinopril (PRINIVIL,ZESTRIL) 40 MG tablet Take 40 mg by mouth daily.   Yes Historical Provider, MD  metFORMIN (GLUCOPHAGE) 500 MG tablet Take 500-1,000 mg by mouth 2 (two) times daily with a meal. 2 tablets every morning and 1 tablet every evening   Yes Historical Provider, MD  nystatin cream (MYCOSTATIN) Apply 1 application topically 2 (two) times daily.   Yes Historical Provider, MD  risperiDONE (RISPERDAL) 1 MG tablet Take 1 mg by mouth at bedtime.   Yes Historical Provider, MD  senna (SENOKOT) 8.6 MG TABS tablet Take 1 tablet by mouth daily.   Yes Historical Provider, MD  silver sulfADIAZINE (SILVADENE) 1 % cream Apply 1 application topically daily. Apply to vulva lower abdomen daily for severe irritation.   Yes Historical Provider, MD  simvastatin (ZOCOR) 40 MG tablet Take 40 mg by mouth at bedtime.   Yes Historical Provider, MD  acetaminophen (TYLENOL) 325 MG tablet Take 650 mg by mouth every 6 (six) hours as needed for mild pain or moderate pain.    Historical Provider, MD  cefUROXime (CEFTIN) 500 MG tablet Take 1 tablet (500 mg total) by mouth 2 (two) times daily with a meal. Antibiotic to be taken for 3  more days. Patient not taking: Reported on 05/03/2015 04/21/15   Rexene Alberts, MD  docusate sodium (COLACE) 100 MG capsule Take 100 mg by mouth 2 (two) times daily as needed for mild constipation.    Historical Provider, MD   No Known Allergies  Review of Systems  Unable to perform ROS: Dementia    Physical Exam  Nursing note and vitals reviewed. Constitutional: No distress.  HENT:  Head: Normocephalic and atraumatic.  Cardiovascular: Normal rate and regular rhythm.   No  swelling noted  Respiratory: Effort normal and breath sounds normal. No respiratory distress.  GI: Soft. She exhibits no distension. There is no guarding.  Neurological: She is alert.  Pleasantly confused  Skin: Skin is warm and dry.    Vital Signs: BP 122/64 mmHg  Pulse 70  Temp(Src) 98.2 F (36.8 C) (Oral)  Resp 18  Ht 5\' 4"  (1.626 m)  Wt 80.1 kg (176 lb 9.4 oz)  BMI 30.30 kg/m2  SpO2 94%  SpO2: SpO2: 94 % O2 Device:SpO2: 94 % O2 Flow Rate: .   IO: Intake/output summary:  Intake/Output Summary (Last 24 hours) at 05/07/15 1248 Last data filed at 05/07/15 0847  Gross per 24 hour  Intake    480 ml  Output   1600 ml  Net  -1120 ml    LBM:   Baseline Weight: Weight: 90.719 kg (200 lb) Most recent weight: Weight: 80.1 kg (176 lb 9.4 oz)      Palliative Assessment/Data:  Flowsheet Rows        Most Recent Value   Intake Tab    Referral Department  Hospitalist   Unit at Time of Referral  Med/Surg Unit   Palliative Care Primary Diagnosis  Cancer   Date Notified  05/06/15   Palliative Care Type  New Palliative care   Reason for referral  Clarify Goals of Care, End of Life Care Assistance   Date of Admission  05/03/15   Date first seen by Palliative Care  05/07/15   # of days Palliative referral response time  1 Day(s)   # of days IP prior to Palliative referral  3   Clinical Assessment    Palliative Performance Scale Score  30%   Pain Max last 24 hours  Not able to report   Pain Min Last 24  hours  Not able to report   Dyspnea Max Last 24 Hours  Not able to report   Dyspnea Min Last 24 hours  Not able to report   Psychosocial & Spiritual Assessment    Palliative Care Outcomes    Patient/Family meeting held?  Yes   Who was at the meeting?  Legal guardian Timoteo Ace by phone   Palliative Care Outcomes  Clarified goals of care, Changed CPR status, Completed durable DNR, Provided psychosocial or spiritual support, Provided advance care planning   Patient/Family wishes: Interventions discontinued/not started   Mechanical Ventilation, Trach, Hemodialysis   Palliative Care follow-up planned  -- [Follow-up at [facility]]      Additional Data Reviewed:  CBC:    Component Value Date/Time   WBC 3.2* 05/07/2015 0553   WBC 3.9 09/16/2011 1848   HGB 10.2* 05/07/2015 0553   HGB 15.3 09/16/2011 1848   HCT 33.2* 05/07/2015 0553   HCT 43.5 09/16/2011 1848   PLT 241 05/07/2015 0553   PLT 173 09/16/2011 1848   MCV 78.5 05/07/2015 0553   MCV 90 09/16/2011 1848   NEUTROABS 1.2* 05/03/2015 1915   LYMPHSABS 1.0 05/03/2015 1915   MONOABS 1.0 05/03/2015 1915   EOSABS 0.2 05/03/2015 1915   BASOSABS 0.0 05/03/2015 1915   Comprehensive Metabolic Panel:    Component Value Date/Time   NA 137 05/07/2015 0553   NA 142 09/16/2011 1848   K 3.6 05/07/2015 0553   K 3.5 09/16/2011 1848   CL 104 05/07/2015 0553   CL 108* 09/16/2011 1848   CO2 27 05/07/2015 0553   CO2 26 09/16/2011 1848   BUN 5* 05/07/2015 0553   BUN 8  09/16/2011 1848   CREATININE 0.59 05/07/2015 0553   CREATININE 0.67 09/16/2011 1848   GLUCOSE 181* 05/07/2015 0553   GLUCOSE 192* 09/16/2011 1848   CALCIUM 7.6* 05/07/2015 0553   CALCIUM 8.9 09/16/2011 1848   AST 22 04/16/2015 0518   AST 23 09/16/2011 1848   ALT 12* 04/16/2015 0518   ALT 15 09/16/2011 1848   ALKPHOS 61 04/16/2015 0518   ALKPHOS 133 09/16/2011 1848   BILITOT 0.6 04/16/2015 0518   BILITOT 0.1* 09/16/2011 1848   PROT 5.7* 04/16/2015 0518   PROT 7.0  09/16/2011 1848   ALBUMIN 2.7* 04/16/2015 0518   ALBUMIN 3.5 09/16/2011 1848     Time In: 1000 Time Out: 1115 Time Total: 75 minutes Goals of care discussion shared with nursing staff, case manager, Education officer, museum, and Dr. Candyce Churn. Greater than 50%  of this time was spent counseling and coordinating care related to the above assessment and plan.  Signed by: Drue Novel, NP  Drue Novel, NP  05/07/2015, 12:48 PM  Please contact Palliative Medicine Team phone at [phone] for questions and concerns.

## 2015-05-07 NOTE — Progress Notes (Signed)
Patient ID: Felicia Grant, female   DOB: 11-04-1945, 70 y.o.   MRN: [id number]         [facility] for Infectious Disease    Date of Admission:  05/03/2015   Total days of antibiotics 4        Day 2 vancomycin and cefazolin  [name]'s admission blood cultures reveal transient MRSA bacteremia. Repeat blood cultures obtained yesterday are pending. She is now afebrile. No vegetations were noted on the transthoracic echocardiogram. There was no mention of her pacemaker wires being seen. I will continue vancomycin and stop cefazolin now. I would hold off on PICC placement until we know that repeat blood cultures are negative. I would hold off on TEE pending daily at palliative care consult and evaluation of goals of care. Optimal duration of care will depend on the results of repeat blood cultures and goals of care.         Michel Bickers, MD [facility] for Infectious [place] [facility] [phone] pager   [phone] cell 02/17/2015, 1:32 PM

## 2015-05-07 NOTE — Progress Notes (Signed)
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 -Has follow-up arranged with GYN and oncology GYN.  Catheter associated UTI -On 3/1 she had a Proteus UTI, culture from this admission remains negative. -No evidence for sepsis.  MRSA bacteremia -1 out of 2 cultures has turned positive; repeat BC are still pending. - has been seen remotely by infectious diseases, agree with IV vancomycin. Ancef has been discontinued. -2-D echo has been ordered.(negative for vegetations). -Because she has a pacemaker, will need TEE if 2-D echo negative for optimal plan of care and treatment. -Given patient's dementia, Invasive vaginal cancer and now MRSA bacteremia, have requested palliative care consult to further determine GOC and treatment plan. -I would suggest placement of PICC once we can ensure bacteremia has cleared and treatment with IV antibiotics for 4 weeks, without TEE or consideration to PPM exchange. She has invasive SCC of the vagina and it is not clear whether she wants to pursue treatment of this.  Bilateral hydronephrosis and bladder outlet obstruction -Secondary to vaginal tumor. -Continue Foley catheter for drainage.  Type 2 diabetes -Well controlled, continue current regimen  COPD -Stable, not exacerbated  Hypertension -Stable, continue home medications   Code Status: Full code Family Communication: patient only  Disposition Plan: to be determined   Consultants:  GYN  ID   Antibiotics:  Vancomycin   Subjective: Feels well, no complaints  Objective: Filed Vitals:   05/06/15 1552 05/06/15 2215 05/07/15 0607 05/07/15 1426  BP: 143/71 133/64 122/64 99/44  Pulse: 72 71 70 68  Temp: 99.4 F (37.4 C) 98.9 F (37.2 C) 98.2 F (36.8 C) 99.3 F (37.4 C)  TempSrc: Oral Oral Oral Oral  Resp: 18  18 18 18   Height:      Weight:   80.1 kg (176 lb 9.4 oz)   SpO2: 69% 93% 94% 97%    Intake/Output Summary (Last 24 hours) at 05/07/15 1729 Last data filed at 05/07/15 0847  Gross per 24 hour  Intake    480 ml  Output   1600 ml  Net  -1120 ml   Filed Weights   05/05/15 0708 05/06/15 0618 05/07/15 0607  Weight: 82.2 kg (181 lb 3.5 oz) 80.2 kg (176 lb 12.9 oz) 80.1 kg (176 lb 9.4 oz)    Exam:   General:  AA Ox2  Cardiovascular: RRR  Respiratory: CTA B  Abdomen: S/NT/ND/+BS  Extremities: no C/C/E   Neurologic:  Non-focal  Data Reviewed: Basic Metabolic Panel:  Recent Labs Lab 05/03/15 1752 05/07/15 0553  NA 140 137  K 4.8 3.6  CL 106 104  CO2 27 27  GLUCOSE 133* 181*  BUN 15 5*  CREATININE 0.85 0.59  CALCIUM 7.9* 7.6*   Liver Function Tests: No results for input(s): AST, ALT, ALKPHOS, BILITOT, PROT, ALBUMIN in the last 168 hours. No results for input(s): LIPASE, AMYLASE in the last 168 hours. No results for input(s): AMMONIA in the last 168 hours. CBC:  Recent Labs Lab 05/03/15 1915 05/04/15 0500 05/07/15 0553  WBC 3.4* 3.0* 3.2*  NEUTROABS 1.2*  --   --   HGB 9.7* 9.0* 10.2*  HCT 32.6* 31.5* 33.2*  MCV 81.1 82.7 78.5  PLT 219 189 241   Cardiac Enzymes: No results for input(s): CKTOTAL, CKMB, CKMBINDEX, TROPONINI in the last 168 hours. BNP (last 3 results) No results for input(s): BNP in the  last 8760 hours.  ProBNP (last 3 results) No results for input(s): PROBNP in the last 8760 hours.  CBG:  Recent Labs Lab 05/06/15 1616 05/06/15 2024 05/07/15 0733 05/07/15 1120 05/07/15 1614  GLUCAP 143* 165* 154* 174* 171*    Recent Results (from the past 240 hour(s))  Urine culture     Status: None   Collection Time: 05/01/15 11:00 AM  Result Value Ref Range Status   Urine Culture, Routine Final report  Final   Urine Culture result 1 Comment  Final    Comment: Mixed urogenital flora 10,000-25,000 colony forming units per mL   Urine  culture     Status: None   Collection Time: 05/03/15  7:41 PM  Result Value Ref Range Status   Specimen Description URINE, CLEAN CATCH  Final   Special Requests NONE  Final   Culture   Final    3,000 COLONIES/mL INSIGNIFICANT GROWTH Performed at [facility]    Report Status 05/05/2015 FINAL  Final  Culture, blood (routine x 2)     Status: None (Preliminary result)   Collection Time: 05/03/15  9:38 PM  Result Value Ref Range Status   Specimen Description BLOOD RIGHT HAND  Final   Special Requests BOTTLES DRAWN AEROBIC AND ANAEROBIC [place]  Final   Culture NO GROWTH 4 DAYS  Final   Report Status PENDING  Incomplete  Culture, blood (routine x 2)     Status: None (Preliminary result)   Collection Time: 05/03/15  9:43 PM  Result Value Ref Range Status   Specimen Description BLOOD RIGHT WRIST  Final   Special Requests BOTTLES DRAWN AEROBIC ONLY 8CC  Final   Culture  Setup Time   Final    GRAM POSITIVE COCCI IN CLUSTERS RECOVERED FROM THE AEROBIC BOTTLE CALLED TO [place] ON 05/04/15 AT 2225 BY LOY,C Performed at [place] Performed at [facility]    Report Status PENDING  Incomplete   Organism ID, Bacteria METHICILLIN RESISTANT STAPHYLOCOCCUS AUREUS  Final      Susceptibility   Methicillin resistant staphylococcus aureus - MIC*    CIPROFLOXACIN >=8 RESISTANT Resistant     ERYTHROMYCIN >=8 RESISTANT Resistant     GENTAMICIN <=0.5 SENSITIVE Sensitive     OXACILLIN >=4 RESISTANT Resistant     TETRACYCLINE <=1 SENSITIVE Sensitive     VANCOMYCIN <=0.5 SENSITIVE Sensitive     TRIMETH/SULFA >=320 RESISTANT Resistant     CLINDAMYCIN <=0.25 SENSITIVE Sensitive     RIFAMPIN <=0.5 SENSITIVE Sensitive     Inducible Clindamycin NEGATIVE Sensitive     * METHICILLIN RESISTANT STAPHYLOCOCCUS AUREUS  Culture, blood (routine x 2)     Status: None (Preliminary result)   Collection Time: 05/06/15  1:00 PM    Result Value Ref Range Status   Specimen Description BLOOD LEFT FOREARM  Final   Special Requests BOTTLES DRAWN AEROBIC AND ANAEROBIC 10CC  Final   Culture NO GROWTH < 24 HOURS  Final   Report Status PENDING  Incomplete  Culture, blood (routine x 2)     Status: None (Preliminary result)   Collection Time: 05/06/15  1:15 PM  Result Value Ref Range Status   Specimen Description BLOOD LEFT HAND  Final   Special Requests BOTTLES DRAWN AEROBIC ONLY 6CC  Final   Culture NO GROWTH < 24 HOURS  Final   Report Status PENDING  Incomplete     Studies: No  results found.  Scheduled Meds: . amLODipine  5 mg Oral Daily  . cholecalciferol  400 Units Oral Daily  . ferrous sulfate  325 mg Oral QHS  . insulin aspart  0-5 Units Subcutaneous QHS  . insulin aspart  0-9 Units Subcutaneous TID WC  . lisinopril  40 mg Oral Daily  . nystatin   Topical BID  . risperiDONE  1 mg Oral QHS  . senna  1 tablet Oral Daily  . simvastatin  40 mg Oral QHS  . vancomycin  1,000 mg Intravenous Q12H   Continuous Infusions:   Principal Problem:   Staphylococcus aureus bacteremia Active Problems:   Schizoaffective schizophrenia (HCC)   COPD (chronic obstructive pulmonary disease) (HCC)   Parkinson's disease (HCC)   Cardiac pacemaker in situ   Hydroureteronephrosis   Vulvar irritation   UTI (lower urinary tract infection)   Primary vaginal cancer (HCC)   Dementia   Vagina bleeding   Diabetes mellitus ([place])   Bradycardia   Bacteremia   Palliative care encounter   DNR (do not resuscitate) discussion    Time spent: 25 minutes. Greater than 50% of this time was spent in direct contact with the patient coordinating care.    Lelon Frohlich  [facility] Pager [phone]  If 7PM-7AM, please contact night-coverage at [email or web address], password [email or web address] 05/07/2015, 5:29 PM  LOS: 4 days

## 2015-05-08 ENCOUNTER — Encounter: Payer: Self-pay | Admitting: Oncology

## 2015-05-08 LAB — CULTURE, BLOOD (ROUTINE X 2): CULTURE: NO GROWTH

## 2015-05-08 LAB — GLUCOSE, CAPILLARY
GLUCOSE-CAPILLARY: 110 mg/dL — AB (ref 65–99)
GLUCOSE-CAPILLARY: 112 mg/dL — AB (ref 65–99)
Glucose-Capillary: 174 mg/dL — ABNORMAL HIGH (ref 65–99)
Glucose-Capillary: 117 mg/dL — ABNORMAL HIGH (ref 65–99)

## 2015-05-08 MED ORDER — RISPERIDONE 1 MG PO TABS
5.0000 mg | ORAL_TABLET | Freq: Every day | ORAL | Status: DC
  Administered 2015-05-08 – 2015-05-09 (×2): 5 mg via ORAL
  Filled 2015-05-08 (×2): qty 5

## 2015-05-08 MED ORDER — RISPERIDONE 1 MG PO TABS: 5.0000 mg | ORAL_TABLET | Freq: Every day | ORAL | Status: DC

## 2015-05-08 NOTE — Progress Notes (Addendum)
PROGRESS NOTE  Felicia Grant [id number] DOB: 05/17/1945 DOA: 05/03/2015 PCP: Jani Gravel, MD  Summary: 35 yof with PMH of dementia, HTN, DM, and recent hospitalization March 1-7 for acute pyelonephritis/ Proteus UTI, sepsis, bilat hydronephrosis due to obstructing vaginal tumor, AKI.She presented with recurrent vaginal bleeding and irritation of vulvar area and perineum. She was noted to be febrile and UA revealed UTI with WBC. She has been admitted for GYN consult. She was seen by GYN and arrangements were made for outpatient oncology and radiation evaluations. Plans were made to transport back to assisted living facility but blood culture revealed staph aureus. Currently on IV antibiotics. Per discussions with the patient's legal guardian and palliative care, plan conservative therapy with IV antibiotics. Plan for discharge to skilled nursing facility 3/25.  Assessment/Plan: 1. MRSA bacteremia. Appreciate infectious disease. Per discussion with palliative care and legal guardian plan is for IV antibiotics. Pacemaker in situ. Guardian elects not to remove. 2. Catheters cc UTI, clinically resolved. Afebrile. 3. 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 Outpatient follow-up arranged with GYN oncology/radiation oncology 4. 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  5. Bilateral hydronephrosis and bladder outlet obstruction. Secondary to vaginal tumor. Keep Foley catheter indefinitely. 6. Hypertension, stable. 7. Diabetes mellitus type 2, stable. 8. Schizoaffective schizophrenia. The patient's outpatient therapist Dr. Duane Boston [phone]) called and advised that longstanding dosage of risperidone is 5 mg QHS. Adjustment made.   Overall appears stable.   Continue IV antibiotics as per infectious disease.   Anticipate transfer skilled nursing facility 3/25   Code Status: full code DVT prophylaxis: SCDs Family Communication:    Disposition Plan: SNF  Murray Hodgkins, MD  [facility] Direct contact:  --Via amion app OR  --[email or web address]; password [email or web address] and click  [email or web address] contact night coverage as above 05/08/2015, 5:03 PM  LOS: 5 days   Consultants:  GYN  ID  Procedures:    Antibiotics:  Vancomycin 3/22 >>  HPI/Subjective: No complaints.  Objective: Filed Vitals:   05/07/15 2042 05/08/15 0500 05/08/15 0852 05/08/15 1240  BP: 150/73  112/63 128/53  Pulse: 71  71   Temp: 99 F (37.2 C)     TempSrc: Oral     Resp: 18  16   Height:      Weight:  80.3 kg (177 lb 0.5 oz)    SpO2: 98%  100%     Intake/Output Summary (Last 24 hours) at 05/08/15 1703 Last data filed at 05/08/15 0534  Gross per 24 hour  Intake    240 ml  Output   1850 ml  Net  -1610 ml     Filed Weights   05/06/15 0618 05/07/15 0607 05/08/15 0500  Weight: 80.2 kg (176 lb 12.9 oz) 80.1 kg (176 lb 9.4 oz) 80.3 kg (177 lb 0.5 oz)    Exam:    General:  Appears calm and comfortable Cardiovascular: RRR, no m/r/g.  Respiratory: CTA bilaterally, no w/r/r. Normal respiratory effort. Psychiatric:Follows commands. Rhythmic chewing movement.  New data reviewed:  Blood sugars stable.  Repeat blood cultures no growth thus far.  Scheduled Meds: . amLODipine  5 mg Oral Daily  . cholecalciferol  400 Units Oral Daily  . ferrous sulfate  325 mg Oral QHS  . insulin aspart  0-5 Units Subcutaneous QHS  . insulin aspart  0-9 Units Subcutaneous TID WC  . lisinopril  40 mg Oral Daily  . nystatin   Topical BID  . risperiDONE  5 mg Oral QHS  .  senna  1 tablet Oral Daily  . simvastatin  40 mg Oral QHS  . vancomycin  1,000 mg Intravenous Q12H   Continuous Infusions:   Principal Problem:   Staphylococcus aureus bacteremia Active Problems:   Schizoaffective schizophrenia (HCC)   COPD (chronic obstructive pulmonary disease) (HCC)   Parkinson's disease (HCC)   Cardiac pacemaker in situ   Hydroureteronephrosis   Vulvar  irritation   UTI (lower urinary tract infection)   Primary vaginal cancer (HCC)   Dementia   Vagina bleeding   Diabetes mellitus ([place])   Bradycardia   Bacteremia   Palliative care encounter   DNR (do not resuscitate) discussion   Time spent 20 minutes

## 2015-05-08 NOTE — Progress Notes (Signed)
Patient ID: Felicia Grant, female   DOB: 1945-10-27, 70 y.o.   MRN: [id number]         [facility] for Infectious Disease    Date of Admission:  05/03/2015   Total days of antibiotics 5        Day 3 vancomycin   Ms. Amrein's admission blood cultures revealed transient MRSA bacteremia. Repeat blood cultures are negative at 48 hours. I would go ahead and place a PICC. Given that she has a permanent pacemaker I would treat her for 4 weeks. At this point I would not pursue TEE or pacemaker removal and replacement. I would only consider that if her bacteremia relapses after vancomycin therapy and she is still considered a candidate for full, aggressive coverage. Please call if we can be of further assistance.         Michel Bickers, MD [facility] for Infectious [place] [facility] [phone] pager   [phone] cell 02/17/2015, 1:32 PM

## 2015-05-08 NOTE — Care Management Important Message (Signed)
Important Message  Patient Details  Name: Felicia Grant MRN: [id number] Date of Birth: 05-30-1945   Medicare Important Message Given:  Yes    Sherald Barge, RN 05/08/2015, 9:26 AM

## 2015-05-08 NOTE — Progress Notes (Addendum)
1:27 PM  Patient received passar  ([id number])   30 day.  Expires on: 06/07/15 Patient has been accepted to [facility] and will go to SNF at DC. Trudy at [employer] aware.  Guardian also made aware of bed choice and information   LCSW continues to follow patient in acute setting. Patient admitted from ALF and at this time will need a higher level of care due to needing IV antibiotics.  Passar has been updated as current is for ALF. Passar pending review due to [place]: Schizoaffective disorder. Awaiting message back to send appropriate clinicals.(clinicals faxed at 12:01 PM)  Awaiting review and pending passar number. Hopeful for 30 day passar  LCSW spoke to guardian and Aram Beecham from [place] and they are both aware of plan and in agreement for SNF with return to ALF after medication completed.  SNF bed search initiated in [facility] with guardian having no preference.   LCSW will follow up with bed offers for facility/patient. Patient will need PICC line for facility.  Plan:  DC to SNF once medically stable  Barriers:  Passar pending due to mental health dx, clinicals being reviewed.   Lane Hacker, MSW Clinical Social Work: Emergency Room [phone]

## 2015-05-08 NOTE — Clinical Social Work Placement (Addendum)
   CLINICAL SOCIAL WORK PLACEMENT  NOTE  Date:  05/08/2015  Patient Details  Name: Felicia Grant MRN: [id number] Date of Birth: 24-Jun-1945  Clinical Social Work is seeking post-discharge placement for this patient at the [place] level of care (*CSW will initial, date and re-position this form in  chart as items are completed):  Yes   Patient/family provided with [place] Work Department's list of facilities offering this level of care within the geographic area requested by the patient (or if unable, by the patient's family).  Yes   Patient/family informed of their freedom to choose among providers that offer the needed level of care, that participate in Medicare, Medicaid or managed care program needed by the patient, have an available bed and are willing to accept the patient.  Yes   Patient/family informed of [facility]'s ownership interest in [facility] and [facility], as well as of the fact that they are under no obligation to receive care at these facilities.  PASRR submitted to EDS on 05/08/15     PASRR number received on     05/08/2015   [id number]    Existing PASRR number confirmed on     05/08/2015 Level Q for ALF placement  FL2 transmitted to all facilities in geographic area requested by pt/family on 05/08/15     FL2 transmitted to all facilities within larger geographic area on       Patient informed that his/her managed care company has contracts with or will negotiate with certain facilities, including the following:            Patient/family informed of bed offers received.  Yes  Patient chooses bed at     [facility] recommends and patient chooses bed at     SNF Patient to be transferred to   on  .  Patient to be transferred to facility by     EMS  Patient family notified on   of transfer.  Legal [place]  Name of family member notified:      See above  PHYSICIAN Please sign FL2, Please sign  DNR     Additional Comment:    _______________________________________________ Lilly [place], LCSW 05/08/2015, 10:34 AM

## 2015-05-08 NOTE — Care Management Note (Signed)
Case Management Note  Patient Details  Name: Felicia Grant MRN: [id number] Date of Birth: [birth year]  Expected Discharge Date:      05/10/2015            Expected Discharge Plan:   SNF  In-House Referral:  Clinical Social Work  Discharge planning Services  CM Consult  Post Acute Care Choice:  NA Choice offered to:  NA  DME Arranged:    DME Agency:     HH Arranged:    [place] Agency:     Status of Service:  Completed, signed off  Medicare Important Message Given:  Yes Date Medicare IM Given:    Medicare IM give by:    Date Additional Medicare IM Given:    Additional Medicare Important Message give by:     If discussed at [place] of Stay Meetings, dates discussed:    Additional Comments: Pt will need IV abx at DC and will need SNF placement. CSW is aware and will make arrangements for placement. Pt active with [place] services PTA, [facility], [place] provider, will be made aware of updated DC plan. No further CM needs anticipated.    Sherald Barge, RN 05/08/2015, 9:39 AM

## 2015-05-08 NOTE — Progress Notes (Signed)
Daily Progress Note   Patient Name: Felicia Grant       Date: 05/08/2015 DOB: 05/24/1945  Age: 70 y.o. MRN#: [id number] Attending Physician: Samuella Cota, MD Primary Care Physician: Jani Gravel, MD Admit Date: 05/03/2015  Reason for Consultation/Follow-up: Disposition, Establishing goals of care and Psychosocial/spiritual support  Subjective: Felicia Grant is resting quietly in bed. She has no complaints of pain. She often smiles at me this morning. I ask if I can share some hard news and she agrees. And I share that we've done some testing and found that she has a cancer, but that we are all in God's hand. She smiles and says, "that's true".   I ask her if she would ever want [place] surgery again and she says that she would not.  Call to [place] guardian, she states that she does not have any questions about our discussion yesterday. I share my conversation with Felicia Grant earlier this morning and Adonis Huguenin states that she appreciates my honesty with Felicia Grant. Adonis Huguenin shares that in the last year she is lost 4 of her wards. She anticipates that Felicia Grant will pass this year, making 5. We talk about social worker and placement in a residential SNF for Felicia Grant.  We talk about the pacemaker. Adonis Huguenin states, "I don't know, she's expressed not wanting surgery again".  She goes on to talk about the cancer, that it has spread, and that the cancer cannot be cured.  She states, again, that she is not sure that Felicia Grant will accept treatment, but that she would like information about the radiation treatment plan. We talk about RAD/ONC appointment scheduled March 27.  I encouraged her to keep this appointment if possible, or make another as soon as possible.    I share was  Adonis Huguenin the palliative medicine team rounds discussion this morning regarding Felicia Grant. That we expect the MRSA to possibly re seed in another place causing her pain, even with treatment, if the pacer is not removed.    Legal guardian, Timoteo Ace decides to not remove the pacer.   She states, "It will not extend her life".   I share the infectious disease consult note with Adonis Huguenin. She agrees to PICC line placement and 4 weeks of IV antibiotic.  I share the risks associated  with IV antibiotic use.  I ask if she would like to know an expected prognosis, and she says yes.  I share that she may have as few as 3 to 4 months.  We talk about the benefits of hospice in the skilled nursing facility, to provide pain relief and dignity during this time.  Length of Stay: 5 days  Current Medications: Scheduled Meds:  . amLODipine  5 mg Oral Daily  . cholecalciferol  400 Units Oral Daily  . ferrous sulfate  325 mg Oral QHS  . insulin aspart  0-5 Units Subcutaneous QHS  . insulin aspart  0-9 Units Subcutaneous TID WC  . lisinopril  40 mg Oral Daily  . nystatin   Topical BID  . risperiDONE  1 mg Oral QHS  . senna  1 tablet Oral Daily  . simvastatin  40 mg Oral QHS  . vancomycin  1,000 mg Intravenous Q12H    Continuous Infusions:    PRN Meds: acetaminophen **OR** acetaminophen, ondansetron **OR** ondansetron (ZOFRAN) IV, polyethylene glycol  Physical Exam: Physical Exam  Constitutional: She is oriented to person, place, and time. No distress.  HENT:  Head: Normocephalic and atraumatic.  Cardiovascular: Normal rate and regular rhythm.   Pulmonary/Chest: No respiratory distress.  Abdominal: Soft. She exhibits no distension. There is no guarding.  Neurological: She is alert and oriented to person, place, and time.  Skin: Skin is warm and dry.  Nursing note and vitals reviewed.               Vital Signs: BP 112/63 mmHg  Pulse 71  Temp(Src) 99 F (37.2 C) (Oral)  Resp 16  Ht 5\' 4"  (1.626  m)  Wt 80.3 kg (177 lb 0.5 oz)  BMI 30.37 kg/m2  SpO2 100% SpO2: SpO2: 100 % O2 Device: O2 Device: Not Delivered O2 Flow Rate:    Intake/output summary:  Intake/Output Summary (Last 24 hours) at 05/08/15 1140 Last data filed at 05/08/15 0534  Gross per 24 hour  Intake    680 ml  Output   1850 ml  Net  -1170 ml   LBM:   Baseline Weight: Weight: 90.719 kg (200 lb) Most recent weight: Weight: 80.3 kg (177 lb 0.5 oz)       Palliative Assessment/Data: Flowsheet Rows        Most Recent Value   Intake Tab    Referral Department  Hospitalist   Unit at Time of Referral  Med/Surg Unit   Palliative Care Primary Diagnosis  Cancer   Date Notified  05/06/15   Palliative Care Type  New Palliative care   Reason for referral  Clarify Goals of Care, End of Life Care Assistance   Date of Admission  05/03/15   Date first seen by Palliative Care  05/07/15   # of days Palliative referral response time  1 Day(s)   # of days IP prior to Palliative referral  3   Clinical Assessment    Palliative Performance Scale Score  30%   Pain Max last 24 hours  Not able to report   Pain Min Last 24 hours  Not able to report   Dyspnea Max Last 24 Hours  Not able to report   Dyspnea Min Last 24 hours  Not able to report   Psychosocial & Spiritual Assessment    Palliative Care Outcomes    Patient/Family meeting held?  Yes   Who was at the meeting?  Legal guardian Timoteo Ace by phone  Palliative Care Outcomes  Clarified goals of care, Changed CPR status, Completed durable DNR, Provided psychosocial or spiritual support, Provided advance care planning   Patient/Family wishes: Interventions discontinued/not started   Mechanical Ventilation, Trach, Hemodialysis   Palliative Care follow-up planned  -- [Follow-up at [facility]]      Additional Data Reviewed: CBC    Component Value Date/Time   WBC 3.2* 05/07/2015 0553   WBC 3.9 09/16/2011 1848   RBC 4.23 05/07/2015 0553   RBC 3.61* 04/15/2015 1915   RBC  4.84 09/16/2011 1848   HGB 10.2* 05/07/2015 0553   HGB 15.3 09/16/2011 1848   HCT 33.2* 05/07/2015 0553   HCT 43.5 09/16/2011 1848   PLT 241 05/07/2015 0553   PLT 173 09/16/2011 1848   MCV 78.5 05/07/2015 0553   MCV 90 09/16/2011 1848   MCH 24.1* 05/07/2015 0553   MCH 31.6 09/16/2011 1848   MCHC 30.7 05/07/2015 0553   MCHC 35.1 09/16/2011 1848   RDW 17.8* 05/07/2015 0553   RDW 14.4 09/16/2011 1848   LYMPHSABS 1.0 05/03/2015 1915   MONOABS 1.0 05/03/2015 1915   EOSABS 0.2 05/03/2015 1915   BASOSABS 0.0 05/03/2015 1915    CMP     Component Value Date/Time   NA 137 05/07/2015 0553   NA 142 09/16/2011 1848   K 3.6 05/07/2015 0553   K 3.5 09/16/2011 1848   CL 104 05/07/2015 0553   CL 108* 09/16/2011 1848   CO2 27 05/07/2015 0553   CO2 26 09/16/2011 1848   GLUCOSE 181* 05/07/2015 0553   GLUCOSE 192* 09/16/2011 1848   BUN 5* 05/07/2015 0553   BUN 8 09/16/2011 1848   CREATININE 0.59 05/07/2015 0553   CREATININE 0.67 09/16/2011 1848   CALCIUM 7.6* 05/07/2015 0553   CALCIUM 8.9 09/16/2011 1848   PROT 5.7* 04/16/2015 0518   PROT 7.0 09/16/2011 1848   ALBUMIN 2.7* 04/16/2015 0518   ALBUMIN 3.5 09/16/2011 1848   AST 22 04/16/2015 0518   AST 23 09/16/2011 1848   ALT 12* 04/16/2015 0518   ALT 15 09/16/2011 1848   ALKPHOS 61 04/16/2015 0518   ALKPHOS 133 09/16/2011 1848   BILITOT 0.6 04/16/2015 0518   BILITOT 0.1* 09/16/2011 1848   GFRNONAA >60 05/07/2015 0553   GFRNONAA >60 09/16/2011 1848   GFRAA >60 05/07/2015 0553   GFRAA >60 09/16/2011 1848       Problem List:  Patient Active Problem List   Diagnosis Date Noted  . Palliative care encounter   . DNR (do not resuscitate) discussion   . Staphylococcus aureus bacteremia 05/06/2015  . Diabetes mellitus ([place]) 05/06/2015  . Bradycardia 05/06/2015  . Bacteremia 05/06/2015  . Primary vaginal cancer ([place]) 05/03/2015  . Dementia 05/03/2015  . Vagina bleeding 05/03/2015  . Vulvar irritation 05/01/2015  . UTI (lower  urinary tract infection) 05/01/2015  . Hydroureteronephrosis 04/16/2015  . Sepsis ([place]) 04/15/2015  . Schizoaffective schizophrenia ([place]) 04/15/2015  . COPD (chronic obstructive pulmonary disease) ([place]) 04/15/2015  . Parkinson's disease ([place]) 04/15/2015  . Cardiac pacemaker in situ 04/15/2015     Palliative Care Assessment & Plan    1.Code Status:  DNR    Code Status Orders        Start     Ordered   05/07/15 1213  Do not attempt resuscitation (DNR)   Continuous    Question Answer Comment  In the event of cardiac or respiratory ARREST Do not call a "code blue"   In the event of cardiac or  respiratory ARREST Do not perform Intubation, CPR, defibrillation or ACLS   In the event of cardiac or respiratory ARREST Use medication by any route, position, wound care, and other measures to relive pain and suffering. May use oxygen, suction and manual treatment of airway obstruction as needed for comfort.      05/07/15 1213    Code Status History    Date Active Date Inactive Code Status Order ID Comments User Context   05/04/2015  4:32 AM 05/07/2015 12:13 PM Full Code [id number]  Roney Jaffe, MD Inpatient   04/15/2015  6:19 PM 04/21/2015  7:39 PM Full Code [id number]  Rexene Alberts, MD ED       2. Goals of Care/Additional Recommendations:  PIC line for 4 weeks of IV antibiotic use. Radiation oncology to provide palliative radiation for squamous cell carcinoma of vagina.  Limitations on Scope of Treatment: Allowing natural death  Desire for further Chaplaincy support: not discussed today  Psycho-social Needs: Education on Hospice  3. Symptom Management:      1. Per hospitalist  4. Palliative Prophylaxis:   Frequent Pain Assessment, Palliative Wound Care and Turn Reposition  5. Prognosis: Unable to determine, based on outcomes. Likely 3 to 4 months due to M RSA bacteremia, with pacemaker, also squamous cell carcinoma of the vagina.  6. Discharge Planning:  [place] for rehab with Palliative care service follow-up   Care plan was discussed with Nursing staff, case manager, social worker, and Dr. Sarajane Jews.  Thank you for allowing the Palliative Medicine Team to assist in the care of this patient.   Time In: 1005 Time Out: 1040 Total Time 35 minutes Prolonged Time Billed  no         Drue Novel, NP  05/08/2015, 11:40 AM  Please contact Palliative Medicine Team phone at [phone] for questions and concerns.

## 2015-05-08 NOTE — Progress Notes (Signed)
GYN Location of Tumor / Histology: invasive squamous cell carcinoma of the vagina   Felicia Grant presented with symptoms of: Vaginal friable mass with extension to the urethra found by Dr. Alyson Ingles with urology.  Biopsies revealed:   04/20/15 Diagnosis Vagina, biopsy, right sidewall - INVASIVE SQUAMOUS CELL CARCINOMA.  Past/Anticipated interventions by Gyn/Onc surgery, if any:   Past/Anticipated interventions by medical oncology, if any:   Weight changes, if any:   Bowel/Bladder complaints, if any: ,   Nausea/Vomiting, if any:   Pain issues, if any:    SAFETY ISSUES:  Prior radiation?   Pacemaker/ICD?   Possible current pregnancy?   Is the patient on methotrexate?   Current Complaints / other details:

## 2015-05-08 NOTE — NC FL2 (Signed)
[place] MEDICAID FL2 LEVEL OF CARE SCREENING TOOL     IDENTIFICATION  Patient Name: Felicia Grant Birthdate: 1946-01-07 Sex: female Admission Date (Current Location): 05/03/2015  [facility] [place] and [place] Number:  [employer] and Address:  [facility] [address] [place]      Provider Number: [id number]  Attending Physician Name and Address:  Samuella Cota, MD  Relative Name and Phone Number:  Legal GuardianAdonis [id number]  [phone]    Current Level of Care: Hospital Recommended Level of Care: [place] Prior Approval Number:    Date Approved/Denied:   PASRR Number:   [id number] E  SS #  [id number] Discharge Plan: SNF    Current Diagnoses: Patient Active Problem List   Diagnosis Date Noted  . Palliative care encounter   . DNR (do not resuscitate) discussion   . Staphylococcus aureus bacteremia 05/06/2015  . Diabetes mellitus ([place]) 05/06/2015  . Bradycardia 05/06/2015  . Bacteremia 05/06/2015  . Primary vaginal cancer ([place]) 05/03/2015  . Dementia 05/03/2015  . Vagina bleeding 05/03/2015  . Vulvar irritation 05/01/2015  . UTI (lower urinary tract infection) 05/01/2015  . Hydroureteronephrosis 04/16/2015  . Sepsis ([place]) 04/15/2015  . Schizoaffective schizophrenia ([place]) 04/15/2015  . COPD (chronic obstructive pulmonary disease) ([place]) 04/15/2015  . Parkinson's disease ([place]) 04/15/2015  . Cardiac pacemaker in situ 04/15/2015    Orientation RESPIRATION BLADDER Height & Weight     Self, Place  Normal Indwelling catheter, Continent (cath for vaginal discharge) Weight: 177 lb 0.5 oz (80.3 kg) Height:  5\' 4"  (162.6 cm)  BEHAVIORAL SYMPTOMS/MOOD NEUROLOGICAL BOWEL NUTRITION STATUS  Other (Comment) (none)   stable Continent Diet (regular)  AMBULATORY STATUS COMMUNICATION OF NEEDS Skin   Extensive Assist Verbally Normal                       Personal Care Assistance Level of Assistance  Bathing, Feeding,  Dressing Bathing Assistance: Limited assistance Feeding assistance: Limited assistance Dressing Assistance: Limited assistance     Functional Limitations Info  Sight, Hearing, Speech Sight Info: Adequate Hearing Info: Adequate Speech Info: Adequate    SPECIAL CARE FACTORS FREQUENCY   (long term antibiotics)                    Contractures Contractures Info: Not present    Additional Factors Info  Code Status, Allergies, Psychotropic, Insulin Sliding Scale, Isolation Precautions Code Status Info: DNR Allergies Info: NKA Psychotropic Info: Risperadol Insulin Sliding Scale Info: 4x daily Isolation Precautions Info: yes, contact percautions      Current Medications (05/08/2015):  This is the current hospital active medication list Current Facility-Administered Medications  Medication Dose Route Frequency Provider Last Rate Last Dose  . acetaminophen (TYLENOL) tablet 650 mg  650 mg Oral Q6H PRN Roney Jaffe, MD       Or  . acetaminophen (TYLENOL) suppository 650 mg  650 mg Rectal Q6H PRN Roney Jaffe, MD      . amLODipine (NORVASC) tablet 5 mg  5 mg Oral Daily Roney Jaffe, MD   5 mg at 05/07/15 1020  . cholecalciferol (VITAMIN D) tablet 400 Units  400 Units Oral Daily Roney Jaffe, MD   400 Units at 05/07/15 1020  . ferrous sulfate tablet 325 mg  325 mg Oral QHS Roney Jaffe, MD   325 mg at 05/07/15 2044  . insulin aspart (novoLOG) injection 0-5 Units  0-5 Units Subcutaneous QHS Roney Jaffe, MD   0  Units at 05/04/15 2206  . insulin aspart (novoLOG) injection 0-9 Units  0-9 Units Subcutaneous TID WC Roney Jaffe, MD   2 Units at 05/07/15 1645  . lisinopril (PRINIVIL,ZESTRIL) tablet 40 mg  40 mg Oral Daily Roney Jaffe, MD   40 mg at 05/07/15 1019  . nystatin (MYCOSTATIN/NYSTOP) topical powder   Topical BID Jonnie Kind, MD      . ondansetron [facility]) tablet 4 mg  4 mg Oral Q6H PRN Roney Jaffe, MD       Or  . ondansetron [facility]) injection 4 mg  4 mg  Intravenous Q6H PRN Roney Jaffe, MD      . polyethylene glycol (MIRALAX / GLYCOLAX) packet 17 g  17 g Oral Daily PRN Roney Jaffe, MD      . risperiDONE (RISPERDAL) tablet 1 mg  1 mg Oral QHS Roney Jaffe, MD   1 mg at 05/07/15 2044  . senna (SENOKOT) tablet 8.6 mg  1 tablet Oral Daily Roney Jaffe, MD   8.6 mg at 05/07/15 1020  . simvastatin (ZOCOR) tablet 40 mg  40 mg Oral QHS Roney Jaffe, MD   40 mg at 05/07/15 2044  . vancomycin (VANCOCIN) IVPB 1000 mg/200 mL premix  1,000 mg Intravenous Q12H Samuella Cota, MD   1,000 mg at 05/08/15 [id number]     Discharge Medications: Please see discharge summary for a list of discharge medications.  Relevant Imaging Results:  Relevant Lab Results:   Additional Information patient from ALF, will return after long term antibiotics is completed.    [place]illy [place], LCSW

## 2015-05-09 ENCOUNTER — Inpatient Hospital Stay (HOSPITAL_COMMUNITY): Payer: Medicare Other

## 2015-05-09 LAB — GLUCOSE, CAPILLARY
GLUCOSE-CAPILLARY: 116 mg/dL — AB (ref 65–99)
GLUCOSE-CAPILLARY: 136 mg/dL — AB (ref 65–99)
GLUCOSE-CAPILLARY: 130 mg/dL — AB (ref 65–99)
Glucose-Capillary: 189 mg/dL — ABNORMAL HIGH (ref 65–99)

## 2015-05-09 NOTE — Progress Notes (Signed)
Called lab at 0530 b/c the Vancomycin trough was not showing, per lab it was not drawn, they said they would notify morning lab to do the draw, unable to hang Vancomycin without the trough results.

## 2015-05-09 NOTE — Progress Notes (Signed)
Found out from lab, patient is refusing to have labs drawn, so we are unable to get a vanc trough. Paging the on call MD to make them aware, day shift RN already aware.

## 2015-05-09 NOTE — Progress Notes (Signed)
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 -Has follow-up arranged with GYN and oncology GYN.  Catheter associated UTI -On 3/1 she had a Proteus UTI, culture from this admission remains negative. -No evidence for sepsis. -Foley needs to remain due to outflow obstruction with vaginal cancer.  MRSA bacteremia -1 out of 2 cultures has turned positive; repeat BC are negative at 3 days. - has been seen remotely by infectious diseases, agree with IV vancomycin. Ancef has been discontinued. -2-D echo has been ordered.(negative for vegetations). -Palliative care has had discussions with patient's legal healthcare power of attorney as she is a ward of the state. Plan is to do a four-week course of IV antibiotics understanding that this   may not be curative especially if she has seeding of her pacemaker wires. -Will request PICC placement today. -DC to SNF in 24-48 hours.  Bilateral hydronephrosis and bladder outlet obstruction -Secondary to vaginal tumor. -Continue Foley catheter for drainage.  Type 2 diabetes -Well controlled, continue current regimen  COPD -Stable, not exacerbated  Hypertension -Stable, continue home medications   Code Status: Full code Family Communication: patient only  Disposition Plan: DC SNF in 24 hours    Consultants:  GYN  ID   Antibiotics:  Vancomycin   Subjective: Feels well, no complaints  Objective: Filed Vitals:   05/08/15 2236 05/09/15 0657 05/09/15 1111 05/09/15 1349  BP: 130/99  114/55 114/93  Pulse: 69  73 89  Temp: 98.6 F (37 C)  99.3 F (37.4 C) 98.7 F (37.1 C)  TempSrc: Oral  Oral Oral  Resp: 20  18 18   Height:      Weight:  79.2 kg (174 lb 9.7 oz)    SpO2: 98%  98% 98%    Intake/Output Summary (Last 24 hours) at 05/09/15 1623 Last data  filed at 05/09/15 1300  Gross per 24 hour  Intake    480 ml  Output   2500 ml  Net  -2020 ml   Filed Weights   05/07/15 0607 05/08/15 0500 05/09/15 0657  Weight: 80.1 kg (176 lb 9.4 oz) 80.3 kg (177 lb 0.5 oz) 79.2 kg (174 lb 9.7 oz)    Exam:   General:  AA Ox2  Cardiovascular: RRR  Respiratory: CTA B  Abdomen: S/NT/ND/+BS  Extremities: no C/C/E   Neurologic:  Non-focal  Data Reviewed: Basic Metabolic Panel:  Recent Labs Lab 05/03/15 1752 05/07/15 0553  NA 140 137  K 4.8 3.6  CL 106 104  CO2 27 27  GLUCOSE 133* 181*  BUN 15 5*  CREATININE 0.85 0.59  CALCIUM 7.9* 7.6*   Liver Function Tests: No results for input(s): AST, ALT, ALKPHOS, BILITOT, PROT, ALBUMIN in the last 168 hours. No results for input(s): LIPASE, AMYLASE in the last 168 hours. No results for input(s): AMMONIA in the last 168 hours. CBC:  Recent Labs Lab 05/03/15 1915 05/04/15 0500 05/07/15 0553  WBC 3.4* 3.0* 3.2*  NEUTROABS 1.2*  --   --   HGB 9.7* 9.0* 10.2*  HCT 32.6* 31.5* 33.2*  MCV 81.1 82.7 78.5  PLT 219 189 241   Cardiac Enzymes: No results for input(s): CKTOTAL, CKMB, CKMBINDEX, TROPONINI in the last 168 hours. BNP (last 3 results) No results for input(s): BNP in the last 8760 hours.  ProBNP (last 3 results) No results for input(s): PROBNP in the last 8760  hours.  CBG:  Recent Labs Lab 05/08/15 1245 05/08/15 1703 05/08/15 2141 05/09/15 0739 05/09/15 1155  GLUCAP 174* 112* 117* 116* 136*    Recent Results (from the past 240 hour(s))  Urine culture     Status: None   Collection Time: 05/01/15 11:00 AM  Result Value Ref Range Status   Urine Culture, Routine Final report  Final   Urine Culture result 1 Comment  Final    Comment: Mixed urogenital flora 10,000-25,000 colony forming units per mL   Urine culture     Status: None   Collection Time: 05/03/15  7:41 PM  Result Value Ref Range Status   Specimen Description URINE, CLEAN CATCH  Final   Special  Requests NONE  Final   Culture   Final    3,000 COLONIES/mL INSIGNIFICANT GROWTH Performed at [facility]    Report Status 05/05/2015 FINAL  Final  Culture, blood (routine x 2)     Status: None   Collection Time: 05/03/15  9:38 PM  Result Value Ref Range Status   Specimen Description BLOOD RIGHT HAND  Final   Special Requests BOTTLES DRAWN AEROBIC AND ANAEROBIC [place]  Final   Culture NO GROWTH 5 DAYS  Final   Report Status 05/08/2015 FINAL  Final  Culture, blood (routine x 2)     Status: None   Collection Time: 05/03/15  9:43 PM  Result Value Ref Range Status   Specimen Description BLOOD RIGHT WRIST  Final   Special Requests BOTTLES DRAWN AEROBIC ONLY 8CC  Final   Culture  Setup Time   Final    GRAM POSITIVE COCCI IN CLUSTERS RECOVERED FROM THE AEROBIC BOTTLE CALLED TO [place] ON 05/04/15 AT 2225 BY LOY,C Performed at [facility]    Culture   Final    [place] Performed at [facility]    Report Status 05/08/2015 FINAL  Final   Organism ID, Bacteria METHICILLIN RESISTANT STAPHYLOCOCCUS AUREUS  Final      Susceptibility   Methicillin resistant staphylococcus aureus - MIC*    CIPROFLOXACIN >=8 RESISTANT Resistant     ERYTHROMYCIN >=8 RESISTANT Resistant     GENTAMICIN <=0.5 SENSITIVE Sensitive     OXACILLIN >=4 RESISTANT Resistant     TETRACYCLINE <=1 SENSITIVE Sensitive     VANCOMYCIN <=0.5 SENSITIVE Sensitive     TRIMETH/SULFA >=320 RESISTANT Resistant     CLINDAMYCIN <=0.25 SENSITIVE Sensitive     RIFAMPIN <=0.5 SENSITIVE Sensitive     Inducible Clindamycin NEGATIVE Sensitive     * METHICILLIN RESISTANT STAPHYLOCOCCUS AUREUS  Culture, blood (routine x 2)     Status: None (Preliminary result)   Collection Time: 05/06/15  1:00 PM  Result Value Ref Range Status   Specimen Description BLOOD LEFT FOREARM  Final   Special Requests BOTTLES DRAWN AEROBIC AND ANAEROBIC 10CC  Final   Culture NO GROWTH 3 DAYS  Final    Report Status PENDING  Incomplete  Culture, blood (routine x 2)     Status: None (Preliminary result)   Collection Time: 05/06/15  1:15 PM  Result Value Ref Range Status   Specimen Description BLOOD LEFT HAND  Final   Special Requests BOTTLES DRAWN AEROBIC ONLY 6CC  Final   Culture NO GROWTH 3 DAYS  Final   Report Status PENDING  Incomplete     Studies: No results found.  Scheduled Meds: . amLODipine  5 mg Oral Daily  . cholecalciferol  400 Units Oral Daily  .  ferrous sulfate  325 mg Oral QHS  . insulin aspart  0-5 Units Subcutaneous QHS  . insulin aspart  0-9 Units Subcutaneous TID WC  . lisinopril  40 mg Oral Daily  . nystatin   Topical BID  . risperiDONE  5 mg Oral QHS  . senna  1 tablet Oral Daily  . simvastatin  40 mg Oral QHS  . vancomycin  1,000 mg Intravenous Q12H   Continuous Infusions:   Principal Problem:   Staphylococcus aureus bacteremia Active Problems:   Schizoaffective schizophrenia (HCC)   COPD (chronic obstructive pulmonary disease) (HCC)   Parkinson's disease (HCC)   Cardiac pacemaker in situ   Hydroureteronephrosis   Vulvar irritation   UTI (lower urinary tract infection)   Primary vaginal cancer (HCC)   Dementia   Vagina bleeding   Diabetes mellitus ([place])   Bradycardia   Bacteremia   Palliative care encounter   DNR (do not resuscitate) discussion    Time spent: 25 minutes. Greater than 50% of this time was spent in direct contact with the patient coordinating care.    Lelon Frohlich  [facility] Pager [phone]  If 7PM-7AM, please contact night-coverage at [email or web address], password [facility] 05/09/2015, 4:23 PM  LOS: 6 days

## 2015-05-10 LAB — GLUCOSE, CAPILLARY
GLUCOSE-CAPILLARY: 155 mg/dL — AB (ref 65–99)
GLUCOSE-CAPILLARY: 123 mg/dL — AB (ref 65–99)
Glucose-Capillary: 129 mg/dL — ABNORMAL HIGH (ref 65–99)
Glucose-Capillary: 149 mg/dL — ABNORMAL HIGH (ref 65–99)

## 2015-05-10 MED ORDER — RISPERIDONE 1 MG PO TABS: 5.0000 mg | ORAL_TABLET | Freq: Every day | ORAL | Status: AC

## 2015-05-10 MED ORDER — VANCOMYCIN HCL IN DEXTROSE 1-5 GM/200ML-% IV SOLN: 1000.0000 mg | Freq: Two times a day (BID) | INTRAVENOUS | Status: AC

## 2015-05-10 NOTE — Clinical Social Work Note (Addendum)
CSW received a call from RN stating patient is medically stable for discharge. CSW contacted [facility] who confirms bed available today- requests [facility] to be called at 2pm.  CSW left message with patient's legal guardian with details concerning discharge- guardian anticipated dc today.  Patient will discharge today per MD order. Patient will discharge to: [facility] RN to call report prior to transportation to: [phone] Transportation: [facility] to be called at 2pm by RN (aware and agreeable)  CSW sent discharge summary to SNF for review.  Packet is complete.  RN, patient and family aware of discharge plans.  Nonnie Done, LCSW [phone]  5N1-9, 2S 15-16 and Psychiatric Service Line  Licensed Clinical Social Worker

## 2015-05-10 NOTE — Progress Notes (Signed)
Patient was discharged to [facility] via EMS with PICC in place in NAD.

## 2015-05-10 NOTE — Progress Notes (Signed)
Report called to Fraser Din, nurse at [facility].  Patient will be discharged to [facility] later this evening to room A21-Bed 1.  Facility requested that 1800 dose of Vancomycin be given prior to discharge because facility will not have dose until in the morning.

## 2015-05-10 NOTE — Discharge Summary (Signed)
Physician Discharge Summary  Felicia Grant [id number] DOB: 27-Aug-1945 DOA: 05/03/2015  PCP: Jani Gravel, MD  Admit date: 05/03/2015 Discharge date: 05/10/2015  Time spent: 45 minutes  Recommendations for Outpatient Follow-up:  -Will be discharged to SNF today. -To continue vancomycin thru the PICC until 06/03/2015. -Has follow up scheduled with GYN oncology.   Discharge Diagnoses:  Principal Problem:   Staphylococcus aureus bacteremia Active Problems:   Schizoaffective schizophrenia ([place])   COPD (chronic obstructive pulmonary disease) (HCC)   Parkinson's disease (HCC)   Cardiac pacemaker in situ   Hydroureteronephrosis   Vulvar irritation   UTI (lower urinary tract infection)   Primary vaginal cancer (HCC)   Dementia   Vagina bleeding   Diabetes mellitus ([place])   Bradycardia   Bacteremia   Palliative care encounter   DNR (do not resuscitate) discussion   Discharge Condition: Stable and improved  Filed Weights   05/08/15 0500 05/09/15 0657 05/10/15 0637  Weight: 80.3 kg (177 lb 0.5 oz) 79.2 kg (174 lb 9.7 oz) 79.1 kg (174 lb 6.1 oz)    History of present illness:  As per Dr. Jonnie Finner on 3/19: Felicia Grant is a 70 y.o. female with hx of dementia, HTN, NIDDM who was recently admitted Mar 1-7, 2017 with acute pyelonephritis/ Proteus UTI, sepsis, bilat hydronephrosis due to obstructing vaginal tumor, AKI. She was dc'd to SNF with indwelling foley cath and pathology was pending. Path ultimately showed SCCa. Patient presenting today to ED with recurrent vag bleeding and irritation of vulvar area and perineum. Temp n ED is 101 deg, pt not toxic. UA +tntc wbc's. ED physician spoke with Dr Glo Herring on call for Ob-Gyn who recommended admission to get patient set up for radiation to her vaginal malignancy. They said they will work on getting this started tomorrow on Monday.   Patient is confused, but pleasant and conversant. She denies vag bleeding, says it's  some "cough syrup" that is "going right through me". No CP, no chills no n/v/d, no abd pain.   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 -Has follow-up arranged with GYN and oncology GYN.  Catheter associated UTI -On 3/1 she had a Proteus UTI, culture from this admission remains negative. -No evidence for sepsis. -Foley needs to remain due to outflow obstruction with vaginal cancer.  MRSA bacteremia -1 out of 2 cultures has turned positive; repeat BC are negative at 3 days. - has been seen remotely by infectious diseases, agree with IV vancomycin. Ancef has been discontinued. -2-D echo has been ordered.(negative for vegetations). -Palliative care has had discussions with patient's legal healthcare power of attorney as she is a ward of the state. Plan is to do a four-week course of IV antibiotics understanding that this  may not be curative especially if she has seeding of her pacemaker wires. -She will continue vancomycin thru her PICC until 06/03/15.  Bilateral hydronephrosis and bladder outlet obstruction -Secondary to vaginal tumor. -Continue Foley catheter for drainage.  Type 2 diabetes -Well controlled, continue current regimen  COPD -Stable, not exacerbated  Hypertension -Stable, continue home medications    Procedures:  None   Consultations:  GYN  ID  Discharge Instructions  Discharge Instructions    Increase activity slowly    Complete by:  As directed             Medication List    STOP taking these medications        cefUROXime 500 MG  tablet  Commonly known as:  CEFTIN     fluconazole 150 MG tablet  Commonly known as:  DIFLUCAN     silver sulfADIAZINE 1 % cream  Commonly known as:  SILVADENE      TAKE these medications        acetaminophen 325 MG tablet  Commonly known as:  TYLENOL  Take 650 mg by mouth every 6 (six) hours as needed for mild pain or moderate pain.      amLODipine 5 MG tablet  Commonly known as:  NORVASC  Take 5 mg by mouth daily.     cetirizine 10 MG tablet  Commonly known as:  ZYRTEC  Take 10 mg by mouth daily.     cholecalciferol 400 units Tabs tablet  Commonly known as:  VITAMIN D  Take 400 Units by mouth daily.     docusate sodium 100 MG capsule  Commonly known as:  COLACE  Take 100 mg by mouth 2 (two) times daily as needed for mild constipation.     ferrous sulfate 325 (65 FE) MG tablet  Take 325 mg by mouth at bedtime.     guaiFENesin 600 MG 12 hr tablet  Commonly known as:  MUCINEX  Take 600 mg by mouth at bedtime as needed for cough.     lisinopril 40 MG tablet  Commonly known as:  PRINIVIL,ZESTRIL  Take 40 mg by mouth daily.     metFORMIN 500 MG tablet  Commonly known as:  GLUCOPHAGE  Take 500-1,000 mg by mouth 2 (two) times daily with a meal. 2 tablets every morning and 1 tablet every evening     nystatin cream  Commonly known as:  MYCOSTATIN  Apply 1 application topically 2 (two) times daily.     risperiDONE 1 MG tablet  Commonly known as:  RISPERDAL  Take 5 tablets (5 mg total) by mouth at bedtime.     senna 8.6 MG Tabs tablet  Commonly known as:  SENOKOT  Take 1 tablet by mouth daily.     simvastatin 40 MG tablet  Commonly known as:  ZOCOR  Take 40 mg by mouth at bedtime.     vancomycin 1 GM/200ML Soln  Commonly known as:  VANCOCIN  Inject 200 mLs (1,000 mg total) into the vein every 12 (twelve) hours.       No Known Allergies     Follow-up Information    Follow up with [facility].   Why:  currently at 9:30 in the morning with Dr. Charlaine Dalton [phone] at the [facility] cancer center.       The results of significant diagnostics from this hospitalization (including imaging, microbiology, ancillary and laboratory) are listed below for reference.    Significant Diagnostic Studies: Ct Abdomen Pelvis Wo Contrast  04/15/2015  CLINICAL DATA:  70 year old female inpatient  with lower abdominal pain, nausea and vomiting. Dysuria. EXAM: CT ABDOMEN AND PELVIS WITHOUT CONTRAST TECHNIQUE: Multidetector CT imaging of the abdomen and pelvis was performed following the standard protocol without IV contrast. COMPARISON:  None. FINDINGS: Lower chest: No significant pulmonary nodules or acute consolidative airspace disease. There is a bulla/pneumatocele at the right lower lobe base. Pacer leads are seen terminating in the right atrium and right ventricular apex. Trace pericardial fluid/thickening. Coronary atherosclerosis. Nonspecific calcifications are present throughout both breasts. Hepatobiliary: Normal liver with no liver mass. Normal gallbladder with no radiopaque cholelithiasis. No biliary ductal dilatation. Pancreas: There is complete fatty replacement of the pancreas, with no pancreatic  mass or appreciable pancreatic ductal dilatation. Spleen: Normal size. No mass. Adrenals/Urinary Tract: Mild diffuse thickening of both adrenal glands, without discrete adrenal nodule, suggesting mild adrenal hyperplasia. There is symmetric mild bilateral hydroureteronephrosis. No renal or ureteral stones. No contour deforming renal masses. A prominently distended urinary bladder with diffuse bladder wall thickening and prominent fat stranding surrounding the entire bladder wall. Stomach/Bowel: Grossly normal stomach. Normal caliber small bowel with no small bowel wall thickening. Appendix is not discretely visualized. Normal large bowel with no diverticulosis, large bowel wall thickening or pericolonic fat stranding. Vascular/Lymphatic: Atherosclerotic nonaneurysmal abdominal aorta. No pathologically enlarged lymph nodes in the abdomen or pelvis. Reproductive: No adnexal mass. The region of the uterus is poorly visualized on this noncontrast study. Uterus could be atrophic or surgically absent. Other: No pneumoperitoneum, ascites or focal fluid collection. Musculoskeletal: No aggressive appearing focal  osseous lesions. 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 Moderate degenerative changes in the visualized thoracolumbar spine. IMPRESSION: 1. Prominently distended urinary bladder. Mild bilateral hydroureteronephrosis. Diffuse bladder wall thickening and prominent perivesical fat stranding. Findings suggest acute infectious or inflammatory cystitis and bladder outlet obstruction. Urgent Foley catheter placement advised. 2. Additional findings include coronary atherosclerosis, mild adrenal hyperplasia and severe right hip osteoarthritis. These results were called by telephone at the time of interpretation on 04/15/2015 at 9:45 pm to Dr. Dayna Barker, who verbally acknowledged these results. Electronically Signed   By: Ilona Sorrel M.D.   On: 04/15/2015 21:49   Dg Chest 2 View  04/15/2015  CLINICAL DATA:  Pt sent from assisted living for of vomiting. hypotensive and is currently being tx for UTI. EXAM: CHEST  2 VIEW COMPARISON:  None. FINDINGS: LEFT-sided pacemaker overlies normal cardiac silhouette. Low lung volumes. Central venous congestion. No focal infiltrate or pneumothorax. IMPRESSION: Low lung volumes and central venous congestion.  No acute findings. Electronically Signed   By: Suzy Bouchard M.D.   On: 04/15/2015 14:40   US Pelvis Complete  04/17/2015  CLINICAL DATA:  Vaginal mass with extension to urethra on physical exam EXAM: TRANSABDOMINAL ULTRASOUND OF PELVIS TECHNIQUE: Transabdominal ultrasound examination of the pelvis was performed including evaluation of the uterus, ovaries, adnexal regions, and pelvic cul-de-sac. COMPARISON:  None. FINDINGS: Uterus Not visualized. Right ovary Not discretely visualized. Left ovary Not discretely visualized. Other findings: Bladder is notable for an indwelling Foley catheter. IMPRESSION: Uterus and bilateral ovaries are not discretely visualized. Bladder is notable for an indwelling Foley catheter. Electronically Signed   By: Julian Hy M.D.    On: 04/17/2015 12:58   US Renal  04/16/2015  CLINICAL DATA:  Acute kidney injury. EXAM: RENAL / URINARY TRACT ULTRASOUND COMPLETE COMPARISON:  04/15/2015 FINDINGS: Right Kidney: Length: 12.6 cm. Mild hydronephrosis. Normal echogenicity of the renal parenchyma. No stones identified. No discrete renal mass seen. Left Kidney: Length: 12.1 cm. The kidney is very difficult to visualize due to location of bowel and body habitus as well as difficulty positioning the patient. Suspected mild left hydronephrosis. Bladder: There is abnormal echogenic filling defect in the urinary bladder. We did not demonstrate any Doppler flow within this multilobular filling defect which is irregular and accordingly difficult to measure. We did not see ureteral jets. IMPRESSION: 1. Abnormal filling defect in the urinary bladder without definite internal flow. This is irregular and probably blood products. 2. We did not see ureteral jets of urine entering the urinary bladder to reassure for complete patency of the ureters. Also, there is bilateral hydronephrosis. 3. Very poor definition of  the left kidney due to body habitus, bowel positioning, and difficulty positioning the patient. 4. Renal echogenicity normal on the right and probably normal on the left. Electronically Signed   By: Van Clines M.D.   On: 04/16/2015 11:29   Dg Chest Port 1 View  05/09/2015  CLINICAL DATA:  PICC line placement EXAM: PORTABLE CHEST 1 VIEW COMPARISON:  None. FINDINGS: Low lung volumes. Cardiomegaly with pulmonary vascular congestion. No frank interstitial edema. Right arm PICC terminates at the level of the cavoatrial junction. While the tip is poorly visualized, it is positioning is somewhat medial, along the left aspect of the vertebral bodies. This may be secondary to patient positioning/rotation on this supine radiograph. Left subclavian pacemaker. IMPRESSION: Right arm PICC terminates at the level of the cavoatrial junction. Distal tip is  poorly visualized but may be somewhat medial, along the left aspect of the vertebral bodies, possibly secondary to patient positioning/rotation on the supine radiograph. Consider an AP portable upright chest radiograph to confirm that this catheter overlies the SVC, as is suspected. Electronically Signed   By: Julian Hy M.D.   On: 05/09/2015 17:29    Microbiology: Recent Results (from the past 240 hour(s))  Urine culture     Status: None   Collection Time: 05/01/15 11:00 AM  Result Value Ref Range Status   Urine Culture, Routine Final report  Final   Urine Culture result 1 Comment  Final    Comment: Mixed urogenital flora 10,000-25,000 colony forming units per mL   Urine culture     Status: None   Collection Time: 05/03/15  7:41 PM  Result Value Ref Range Status   Specimen Description URINE, CLEAN CATCH  Final   Special Requests NONE  Final   Culture   Final    3,000 COLONIES/mL INSIGNIFICANT GROWTH Performed at [facility]    Report Status 05/05/2015 FINAL  Final  Culture, blood (routine x 2)     Status: None   Collection Time: 05/03/15  9:38 PM  Result Value Ref Range Status   Specimen Description BLOOD RIGHT HAND  Final   Special Requests BOTTLES DRAWN AEROBIC AND ANAEROBIC [place]  Final   Culture NO GROWTH 5 DAYS  Final   Report Status 05/08/2015 FINAL  Final  Culture, blood (routine x 2)     Status: None   Collection Time: 05/03/15  9:43 PM  Result Value Ref Range Status   Specimen Description BLOOD RIGHT WRIST  Final   Special Requests BOTTLES DRAWN AEROBIC ONLY 8CC  Final   Culture  Setup Time   Final    GRAM POSITIVE COCCI IN CLUSTERS RECOVERED FROM THE AEROBIC BOTTLE CALLED TO [place] ON 05/04/15 AT 2225 BY LOY,C Performed at [place] Performed at [facility]    Report Status 05/08/2015 FINAL  Final   Organism ID, Bacteria METHICILLIN RESISTANT STAPHYLOCOCCUS AUREUS   Final      Susceptibility   Methicillin resistant staphylococcus aureus - MIC*    CIPROFLOXACIN >=8 RESISTANT Resistant     ERYTHROMYCIN >=8 RESISTANT Resistant     GENTAMICIN <=0.5 SENSITIVE Sensitive     OXACILLIN >=4 RESISTANT Resistant     TETRACYCLINE <=1 SENSITIVE Sensitive     VANCOMYCIN <=0.5 SENSITIVE Sensitive     TRIMETH/SULFA >=320 RESISTANT Resistant     CLINDAMYCIN <=0.25 SENSITIVE Sensitive     RIFAMPIN <=0.5 SENSITIVE Sensitive  Inducible Clindamycin NEGATIVE Sensitive     * METHICILLIN RESISTANT STAPHYLOCOCCUS AUREUS  Culture, blood (routine x 2)     Status: None (Preliminary result)   Collection Time: 05/06/15  1:00 PM  Result Value Ref Range Status   Specimen Description BLOOD LEFT FOREARM  Final   Special Requests BOTTLES DRAWN AEROBIC AND ANAEROBIC 10CC  Final   Culture NO GROWTH 3 DAYS  Final   Report Status PENDING  Incomplete  Culture, blood (routine x 2)     Status: None (Preliminary result)   Collection Time: 05/06/15  1:15 PM  Result Value Ref Range Status   Specimen Description BLOOD LEFT HAND  Final   Special Requests BOTTLES DRAWN AEROBIC ONLY 6CC  Final   Culture NO GROWTH 3 DAYS  Final   Report Status PENDING  Incomplete     Labs: Basic Metabolic Panel:  Recent Labs Lab 05/03/15 1752 05/07/15 0553  NA 140 137  K 4.8 3.6  CL 106 104  CO2 27 27  GLUCOSE 133* 181*  BUN 15 5*  CREATININE 0.85 0.59  CALCIUM 7.9* 7.6*   Liver Function Tests: No results for input(s): AST, ALT, ALKPHOS, BILITOT, PROT, ALBUMIN in the last 168 hours. No results for input(s): LIPASE, AMYLASE in the last 168 hours. No results for input(s): AMMONIA in the last 168 hours. CBC:  Recent Labs Lab 05/03/15 1915 05/04/15 0500 05/07/15 0553  WBC 3.4* 3.0* 3.2*  NEUTROABS 1.2*  --   --   HGB 9.7* 9.0* 10.2*  HCT 32.6* 31.5* 33.2*  MCV 81.1 82.7 78.5  PLT 219 189 241   Cardiac Enzymes: No results for input(s): CKTOTAL, CKMB, CKMBINDEX, TROPONINI in the  last 168 hours. BNP: BNP (last 3 results) No results for input(s): BNP in the last 8760 hours.  ProBNP (last 3 results) No results for input(s): PROBNP in the last 8760 hours.  CBG:  Recent Labs Lab 05/09/15 1155 05/09/15 1700 05/09/15 2043 05/10/15 0722 05/10/15 1151  GLUCAP 136* 130* 189* 155* 129*       Signed:  HERNANDEZ ACOSTA,Benton Tooker  [facility] Pager: [phone] 05/10/2015, 1:58 PM

## 2015-05-10 NOTE — Progress Notes (Signed)
Pharmacy Antibiotic Note  Felicia Grant is a 70 y.o. female admitted on 05/03/2015 with bacteremia.  Pharmacy has been consulted for vancomycin dosing.  Appreciate ID input.   Plan:  Vancomycin 1gm IV q12 hours  Check trough tomorrow before am dose if pt will allow labs (previously refused)  F/u renal function, cultures and clinical course  Duration of therapy per ID recommendations  Height: 5\' 4"  (162.6 cm) Weight: 174 lb 6.1 oz (79.1 kg) IBW/kg (Calculated) : 54.7  Temp (24hrs), Avg:99 F (37.2 C), Min:98.7 F (37.1 C), Max:99.3 F (37.4 C)   Recent Labs Lab 05/03/15 1752 05/03/15 1902 05/03/15 1915 05/04/15 0500 05/07/15 0553  WBC  --   --  3.4* 3.0* 3.2*  CREATININE 0.85  --   --   --  0.59  LATICACIDVEN 1.8 1.54  --   --   --     Estimated Creatinine Clearance: 65.7 mL/min (by C-G formula based on Cr of 0.59).    No Known Allergies  Anti-infectives    Start     Dose/Rate Route Frequency Ordered Stop   05/06/15 1400  ceFAZolin (ANCEF) IVPB 2g/100 mL premix  Status:  Discontinued     2 g over 30 Minutes Intravenous 3 times per day 05/06/15 1241 05/07/15 1115   05/06/15 0400  vancomycin (VANCOCIN) IVPB 1000 mg/200 mL premix     1,000 mg 200 mL/hr over 60 Minutes Intravenous Every 12 hours 05/05/15 1554     05/05/15 1545  vancomycin (VANCOCIN) 1,500 mg in sodium chloride 0.9 % 500 mL IVPB     1,500 mg 250 mL/hr over 120 Minutes Intravenous  Once 05/05/15 1537 05/06/15 0600   05/04/15 1700  cefUROXime (CEFTIN) tablet 500 mg  Status:  Discontinued     500 mg Oral 2 times daily with meals 05/04/15 0959 05/05/15 1537   05/03/15 2045  cefTRIAXone (ROCEPHIN) 1 g in dextrose 5 % 50 mL IVPB  Status:  Discontinued     1 g 100 mL/hr over 30 Minutes Intravenous Every 24 hours 05/03/15 2037 05/04/15 0959     Results for orders placed or performed during the hospital encounter of 05/03/15  Urine culture     Status: None   Collection Time: 05/03/15  7:41 PM  Result  Value Ref Range Status   Specimen Description URINE, CLEAN CATCH  Final   Special Requests NONE  Final   Culture   Final    3,000 COLONIES/mL INSIGNIFICANT GROWTH Performed at [facility]    Report Status 05/05/2015 FINAL  Final  Culture, blood (routine x 2)     Status: None   Collection Time: 05/03/15  9:38 PM  Result Value Ref Range Status   Specimen Description BLOOD RIGHT HAND  Final   Special Requests BOTTLES DRAWN AEROBIC AND ANAEROBIC [place]  Final   Culture NO GROWTH 5 DAYS  Final   Report Status 05/08/2015 FINAL  Final  Culture, blood (routine x 2)     Status: None   Collection Time: 05/03/15  9:43 PM  Result Value Ref Range Status   Specimen Description BLOOD RIGHT WRIST  Final   Special Requests BOTTLES DRAWN AEROBIC ONLY 8CC  Final   Culture  Setup Time   Final    GRAM POSITIVE COCCI IN CLUSTERS RECOVERED FROM THE AEROBIC BOTTLE CALLED TO [place] ON 05/04/15 AT 2225 BY LOY,C Performed at [place] Performed at  [facility]    Report Status 05/08/2015 FINAL  Final   Organism ID, Bacteria METHICILLIN RESISTANT STAPHYLOCOCCUS AUREUS  Final      Susceptibility   Methicillin resistant staphylococcus aureus - MIC*    CIPROFLOXACIN >=8 RESISTANT Resistant     ERYTHROMYCIN >=8 RESISTANT Resistant     GENTAMICIN <=0.5 SENSITIVE Sensitive     OXACILLIN >=4 RESISTANT Resistant     TETRACYCLINE <=1 SENSITIVE Sensitive     VANCOMYCIN <=0.5 SENSITIVE Sensitive     TRIMETH/SULFA >=320 RESISTANT Resistant     CLINDAMYCIN <=0.25 SENSITIVE Sensitive     RIFAMPIN <=0.5 SENSITIVE Sensitive     Inducible Clindamycin NEGATIVE Sensitive     * METHICILLIN RESISTANT STAPHYLOCOCCUS AUREUS  Culture, blood (routine x 2)     Status: None (Preliminary result)   Collection Time: 05/06/15  1:00 PM  Result Value Ref Range Status   Specimen Description BLOOD LEFT FOREARM  Final   Special Requests BOTTLES  DRAWN AEROBIC AND ANAEROBIC 10CC  Final   Culture NO GROWTH 3 DAYS  Final   Report Status PENDING  Incomplete  Culture, blood (routine x 2)     Status: None (Preliminary result)   Collection Time: 05/06/15  1:15 PM  Result Value Ref Range Status   Specimen Description BLOOD LEFT HAND  Final   Special Requests BOTTLES DRAWN AEROBIC ONLY [place]  Final   Culture NO GROWTH 3 DAYS  Final   Report Status PENDING  Incomplete   Thank you for allowing pharmacy to be a part of this patient's care.  Hart Robinsons A 05/10/2015 11:02 AM

## 2015-05-10 NOTE — Progress Notes (Addendum)
Patient's PICC line flushed with NS.  Site WNL.  Foley catheter intact.  Report previously called to [facility].  Patient stable awaiting EMS for transport to [facility] for discharge.  Patient's guardian notified of patient's discharge today.

## 2015-05-11 ENCOUNTER — Ambulatory Visit: Payer: Medicare Other | Admitting: Gynecology

## 2015-05-11 ENCOUNTER — Telehealth: Payer: Self-pay | Admitting: *Deleted

## 2015-05-11 LAB — CULTURE, BLOOD (ROUTINE X 2)
Culture: NO GROWTH
Culture: NO GROWTH

## 2015-05-11 NOTE — Telephone Encounter (Signed)
A called was placed to [facility] spoke with Lodema Pilot RN for Felicia Grant. RN stated that patient had just arrived at [facility] last night and Pt has not been seen by PT and transportation has not been scheduled for patient to come to her appointment today with Dr. Fermin Schwab.  The option to reschedule her appointment was given. RN declined to reschedule at this time, she stated that someone from [place] would contact our office soon to reschedule.

## 2015-05-12 ENCOUNTER — Ambulatory Visit
Admission: RE | Admit: 2015-05-12 | Discharge: 2015-05-12 | Disposition: A | Discharge status: Home / Self Care | Payer: Medicare Other | Source: Ambulatory Visit | Attending: Radiation Oncology | Admitting: Radiation Oncology

## 2015-05-12 ENCOUNTER — Ambulatory Visit: Admit: 2015-05-12 | Payer: Medicare Other | Admitting: Radiation Oncology

## 2015-05-12 ENCOUNTER — Ambulatory Visit: Payer: Medicare Other

## 2015-05-22 ENCOUNTER — Encounter: Payer: Self-pay | Admitting: Gynecologic Oncology

## 2015-05-22 ENCOUNTER — Ambulatory Visit: Payer: No Typology Code available for payment source | Attending: Gynecologic Oncology | Admitting: Gynecologic Oncology

## 2015-05-22 VITALS — BP 140/51 | HR 75 | Temp 98.9°F | Resp 16 | Ht 64.0 in | Wt 173.2 lb

## 2015-05-22 DIAGNOSIS — Z950 Presence of cardiac pacemaker: Secondary | ICD-10-CM | POA: Insufficient documentation

## 2015-05-22 DIAGNOSIS — B964 Proteus (mirabilis) (morganii) as the cause of diseases classified elsewhere: Secondary | ICD-10-CM | POA: Diagnosis not present

## 2015-05-22 DIAGNOSIS — I10 Essential (primary) hypertension: Secondary | ICD-10-CM | POA: Diagnosis not present

## 2015-05-22 DIAGNOSIS — D62 Acute posthemorrhagic anemia: Secondary | ICD-10-CM | POA: Diagnosis not present

## 2015-05-22 DIAGNOSIS — N1330 Unspecified hydronephrosis: Secondary | ICD-10-CM | POA: Diagnosis not present

## 2015-05-22 DIAGNOSIS — E119 Type 2 diabetes mellitus without complications: Secondary | ICD-10-CM | POA: Insufficient documentation

## 2015-05-22 DIAGNOSIS — C52 Malignant neoplasm of vagina: Secondary | ICD-10-CM | POA: Diagnosis present

## 2015-05-22 DIAGNOSIS — J449 Chronic obstructive pulmonary disease, unspecified: Secondary | ICD-10-CM | POA: Insufficient documentation

## 2015-05-22 DIAGNOSIS — Z7984 Long term (current) use of oral hypoglycemic drugs: Secondary | ICD-10-CM | POA: Diagnosis not present

## 2015-05-22 DIAGNOSIS — F259 Schizoaffective disorder, unspecified: Secondary | ICD-10-CM | POA: Insufficient documentation

## 2015-05-22 DIAGNOSIS — F0390 Unspecified dementia without behavioral disturbance: Secondary | ICD-10-CM | POA: Diagnosis not present

## 2015-05-22 NOTE — Progress Notes (Signed)
Consult Note: Gyn-Onc  Consult was requested by Dr. Mallory Shirk for the evaluation of Felicia Grant 70 y.o. female  CC:  Chief Complaint  Patient presents with  . Vaginal Cancer    New patient    Assessment/Plan:  Felicia Grant  is a 70 y.o.  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  Given her comorbidities, dementia and psychiatric issues, she is not a candidate for curative therapy (eg. She would be unable to be positioned in such a way to receive curative dosing of radiation and chemotherapy). Therefore any treatment attempts would be palliative intent.   I discussed her case by phone with her legal guardian, Timoteo Ace. I discussed with Felicia Grant that there are two options: 1/ palliative intent radiation or 2/ hospice care. I discussed that Felicia Grant had expressed to me that she does not desire treatment. Felicia Grant is in agreement that, given the likely difficulty with administering treatment that would be non-curative in nature, the best course of care is hospice.  I recommend continuing the foley catheter for drainage. I discussed that hospice care would mean that no hospital readmissions would take place for deterioration, and instead Felicia Grant would receive comfort care only.  We will contact her nursing home to attempt to facillicate this through their resident MD.  HPI: Felicia Grant is a 70 year old G0 who is seen in consultation at the request of Dr Glo Herring for locally advanced vaginal cancer.   The patient was admitted to [facility] in early March, 2017 with urinary outlet obstruction and AKI. This was determined to be secondary to outlet obstruction of the urethra and a bladder mass/thickening was noted on [place]. A foley was placed with considerable difficulty and Dr Glo Herring performed a pelvic examination which revealed a right vaginal side wall mass. A biopsy on 04/21/15 showed moderately differentiated squamous cell  carcinoma.  Her AKI resolved with bladder drainage. She then was discharged, but readmitted due to failure to thrive in her assisted living facility and perineal irritation and anemia from vaginal bleeding.  CT scan on 04/15/15 of the abdo/pelvis revealed: A prominently distended urinary bladder, mild bilateral hydronephrosis, diffuse bladder wall thickening and prominent perivesical fat stranding. Distended urinary bladder. No apparent pathologically enlarged lymph nodes. The uterus was not visible and could be surgically absent.  She was provided with supportive care and then discharged to a nursing home ([facility]).  Her legal guardian is Timoteo Ace [phone]).  In addition to her vaginal cancer the patient has a recent diagnosis of MRSA bacteremia treated with IV antibiotics (this diagnosis was made on blood cultures from 05/03/2015). She has a history of Proteus urinary tract infections, bilateral hydronephrosis secondary to obstruction from the vaginal tumor, AK I, acute blood loss anemia, type 2 diabetes mellitus, COPD, pacemaker, dementia, schizo affective schizophrenia.  The patient is conversant and pleasant however not oriented to time or place. She denies knowing her history of cancer and when stated to her denies that this is the case. She believes that something was placed internally and that is what is causing a plaque internally and that she does not have cancer. She declined any treatment stating that she does not need this because she does not have cancer.  Current Meds:  Outpatient Encounter Prescriptions as of 05/22/2015  Medication Sig  . acetaminophen (TYLENOL) 325 MG tablet Take 650 mg by mouth every 6 (six) hours as needed for mild pain or moderate pain.  Marland Kitchen  amLODipine (NORVASC) 5 MG tablet Take 5 mg by mouth daily.  . cetirizine (ZYRTEC) 10 MG tablet Take 10 mg by mouth daily.  . cholecalciferol (VITAMIN D) 400 units TABS tablet Take 400 Units by mouth daily.  Marland Kitchen docusate  sodium (COLACE) 100 MG capsule Take 100 mg by mouth 2 (two) times daily as needed for mild constipation.  . ferrous sulfate 325 (65 FE) MG tablet Take 325 mg by mouth at bedtime.  Marland Kitchen guaiFENesin (MUCINEX) 600 MG 12 hr tablet Take 600 mg by mouth at bedtime as needed for cough.  Marland Kitchen lisinopril (PRINIVIL,ZESTRIL) 40 MG tablet Take 40 mg by mouth daily.  . metFORMIN (GLUCOPHAGE) 500 MG tablet Take 500-1,000 mg by mouth 2 (two) times daily with a meal. 2 tablets every morning and 1 tablet every evening  . nystatin cream (MYCOSTATIN) Apply 1 application topically 2 (two) times daily.  . risperiDONE (RISPERDAL) 1 MG tablet Take 5 tablets (5 mg total) by mouth at bedtime.  . senna (SENOKOT) 8.6 MG TABS tablet Take 1 tablet by mouth daily.  . simvastatin (ZOCOR) 40 MG tablet Take 40 mg by mouth at bedtime.   No facility-administered encounter medications on file as of 05/22/2015.    Allergy: No Known Allergies  Social Hx:   Social History   Social History  . Marital Status: Single    Spouse Name: N/A  . Number of Children: N/A  . Years of Education: N/A   Occupational History  . Not on file.   Social History Main Topics  . Smoking status: Never Smoker   . Smokeless tobacco: Former Systems developer  . Alcohol Use: No  . Drug Use: No  . Sexual Activity: Not Currently    Birth Control/ Protection: Post-menopausal   Other Topics Concern  . Not on file   Social History Narrative    Past Surgical Hx:  Past Surgical History  Procedure Laterality Date  . Hip surgery      Past Medical Hx:  Past Medical History  Diagnosis Date  . Dementia   . Bradycardia   . Hypertension   . Diabetes mellitus without complication ([place])   . Parkinson's disease ([place])   . Schizo affective schizophrenia ([place])   . COPD (chronic obstructive pulmonary disease) ([place])   . Cardiac pacemaker in situ   . 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 Acute blood loss anemia 04/15/2015    Secondary to bleeding vaginal mass.     Past  Gynecological History:  G0  No LMP recorded. Patient is postmenopausal.  Family Hx: History reviewed. No pertinent family history.  Review of Systems:  Constitutional  Feels well,  Denies fevers  ENT Normal appearing ears and nares bilaterally Skin/Breast  No rash, sores, jaundice, itching, dryness Cardiovascular  No chest pain, shortness of breath, or edema  Pulmonary  No cough or wheeze.  Gastro Intestinal  No nausea, vomitting, or diarrhoea. No bright red blood per rectum, no abdominal pain, change in bowel movement, or constipation. + fecal incontinency Genito Urinary  + bleeding and discharge Musculo Skeletal  No myalgia, arthralgia, joint swelling or pain  Neurologic  No weakness, numbness, change in gait,  Psychology  No depression, anxiety, insomnia.   Vitals:  Blood pressure 140/51, pulse 75, temperature 98.9 F (37.2 C), temperature source Oral, resp. rate 16, height 5\' 4"  (1.626 m), weight 173 lb 3.2 oz (78.563 kg), SpO2 100 %.  Physical Exam: WD in NAD Neck  Supple NROM, without any enlargements.  Lymph Node Survey  No cervical supraclavicular or inguinal adenopathy Cardiovascular  Pulse normal rate, regularity and rhythm. S1 and S2 normal.  Lungs  Clear to auscultation bilateraly, without wheezes/crackles/rhonchi. Good air movement.  Skin  No rash/lesions/breakdown  Psychiatry  Alert and oriented to person, place, and time  Abdomen  Normoactive bowel sounds, abdomen soft, non-tender and obese without evidence of hernia.  Back No CVA tenderness Genito Urinary  Vulva/vagina: Normal external female genitalia.  No lesions. No discharge or bleeding. Perineal excoriation from discharge  Bladder/urethra:  No lesions or masses, well supported bladder  Vagina: Mass along right vaginal side wall approximately 6-8 cm, extends to distal 1cm of vagina. Protrudes into vagina. Extends to side wall on right. Anterior vaginal wall not grossly apparently involved. The  mass is fixed to the sidewall.   Cervix: unable to appreciate on vaginal exam  Uterus: unable to appreciate on limited vaginal exam due to inability for the patient to tolerate   Adnexa: no discrete masses. Rectal  Appreciated the right sidewall vaginal mass that is fixed Extremities  No bilateral cyanosis, clubbing or edema.   Donaciano Eva, MD  05/22/2015, 10:20 AM

## 2015-05-22 NOTE — Patient Instructions (Signed)
Plan to have Hospice initiated by your attending physician per Timoteo Ace POA . Please call with any additional questions or concerns .  Thank you

## 2015-10-16 DEATH — deceased

## 2017-03-26 IMAGING — CT CT ABD-PELV W/O CM
2 of 4 series · 15 of 46 positions shown, 17 images · non-contrast
Comparison: None.

CLINICAL DATA: 69-year-old female inpatient with lower abdominal
pain, nausea and vomiting. Dysuria.

EXAM:
CT ABDOMEN AND PELVIS WITHOUT CONTRAST
TECHNIQUE: Multidetector CT imaging of the abdomen and pelvis was performed
following the standard protocol without IV contrast.

[Series 2: abdomen/pelvis w/o contrast · axial · non-contrast · 0.77mm/px · z∈[-476,-36]mm · 12 of 98 slices shown, 14 images]
[im 5/98  soft-tissue]
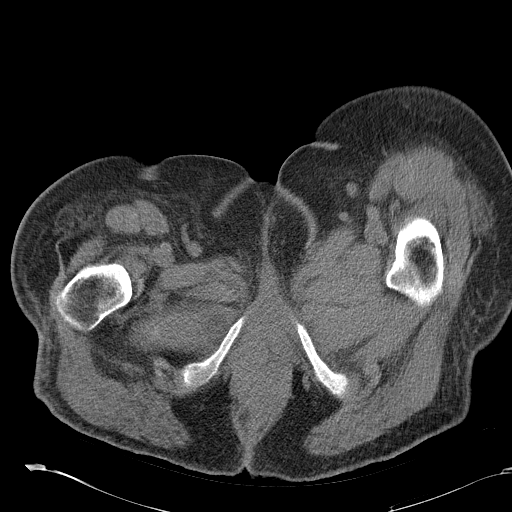
[im 5/98  bone]
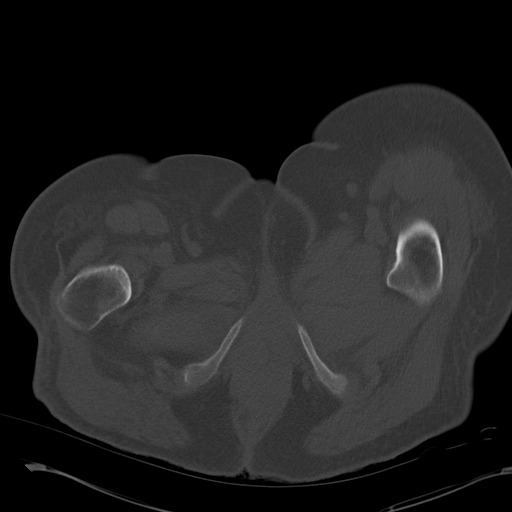
[im 13/98  soft-tissue]
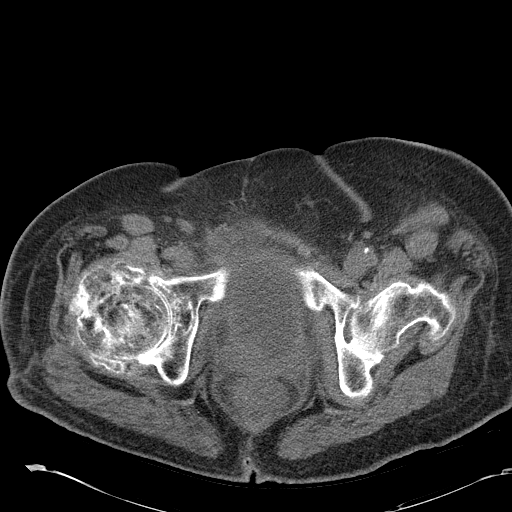
[im 21/98  soft-tissue]
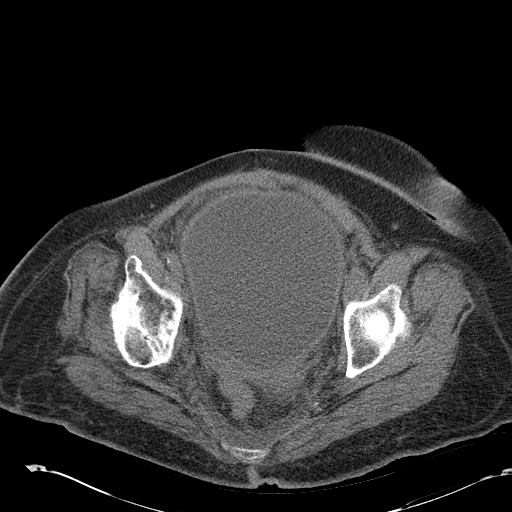
[im 29/98  soft-tissue]
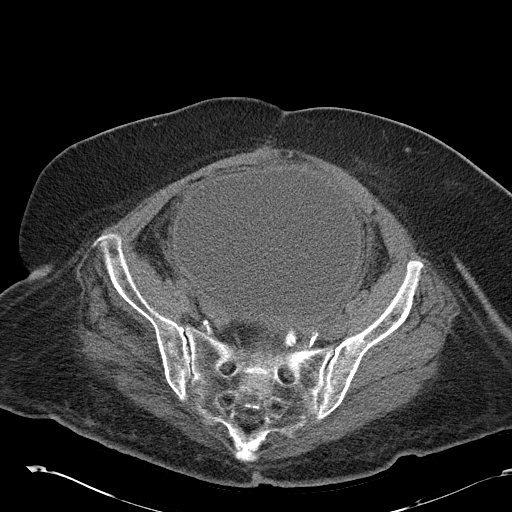
[im 37/98  soft-tissue]
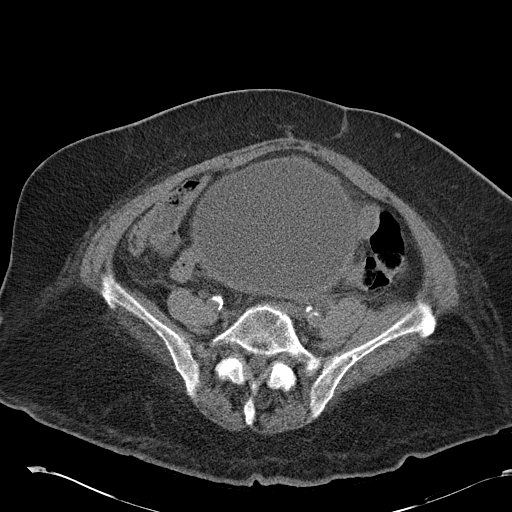
[im 45/98  soft-tissue]
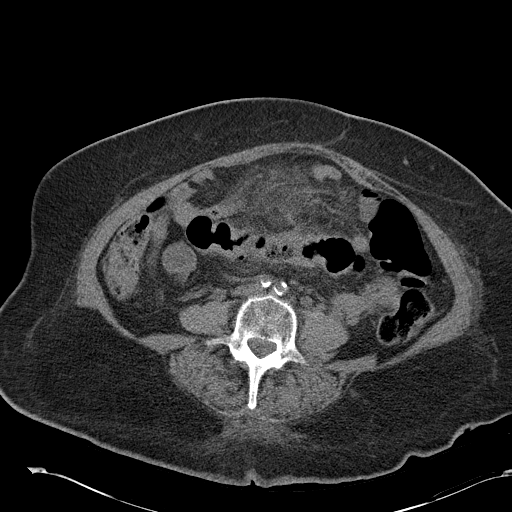
[im 53/98  soft-tissue]
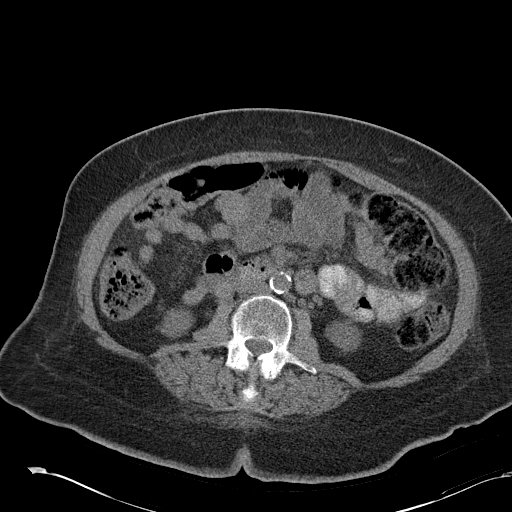
[im 61/98  soft-tissue]
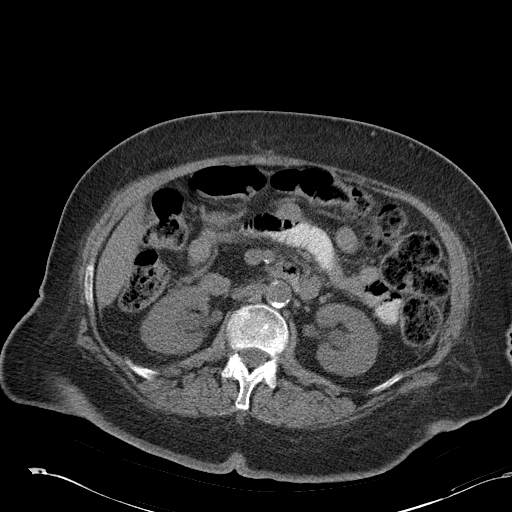
[im 69/98  soft-tissue]
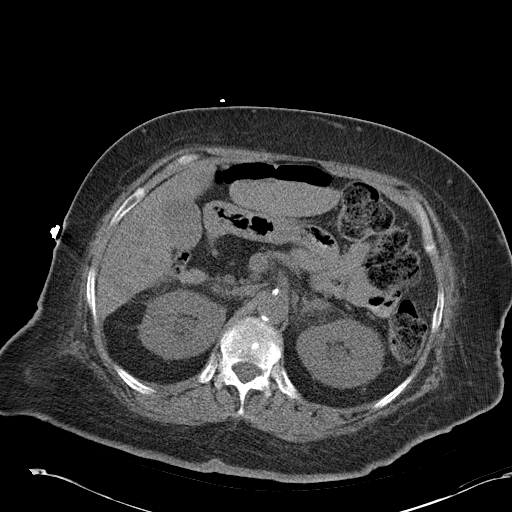
[im 69/98  bone]
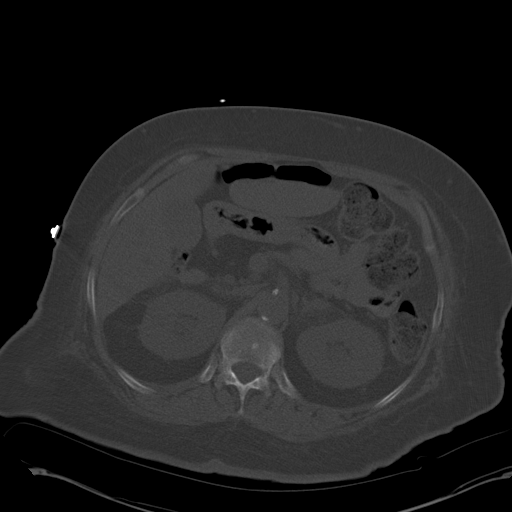
[im 77/98  soft-tissue]
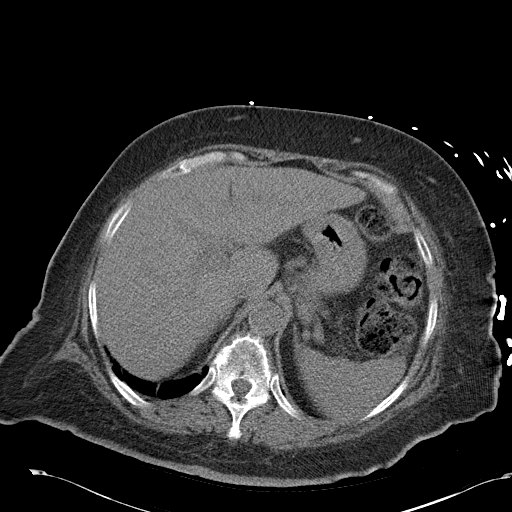
[im 85/98  soft-tissue]
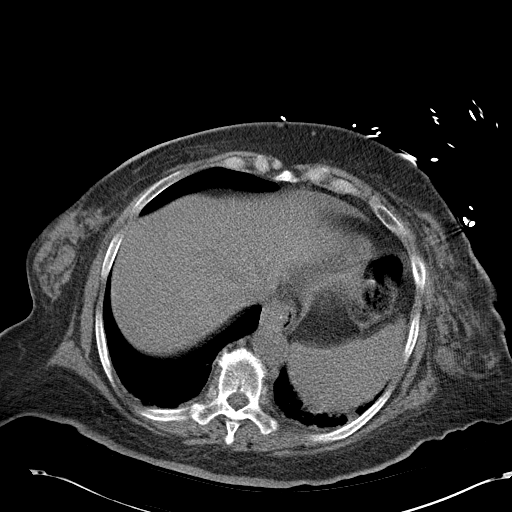
[im 93/98  soft-tissue]
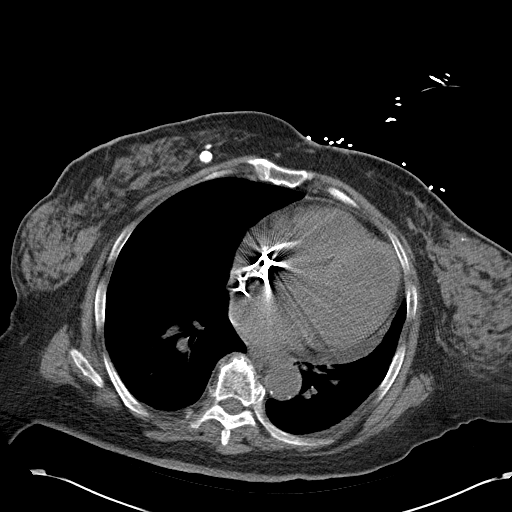

[Series 3: mpr cor 3.0mm · coronal · 0.70mm/px · 3 of 102 slices shown]
[im 34/102  soft-tissue]
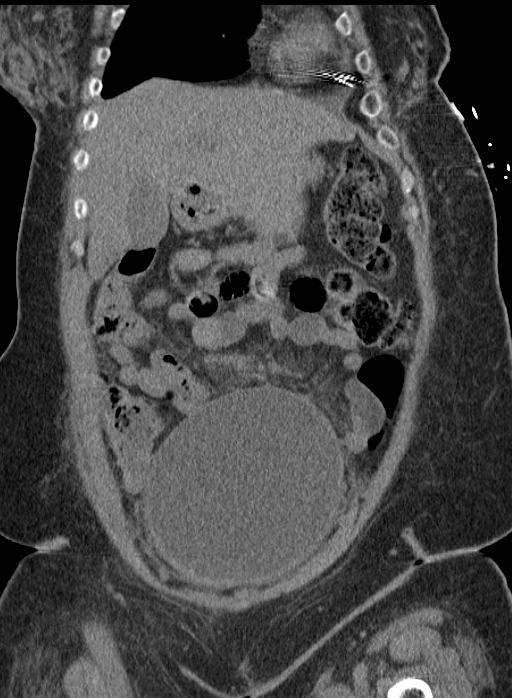
[im 45/102  soft-tissue]
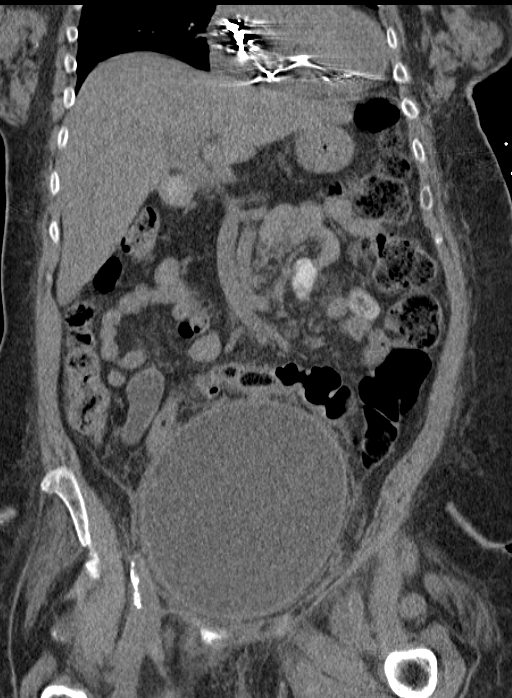
[im 57/102  soft-tissue]
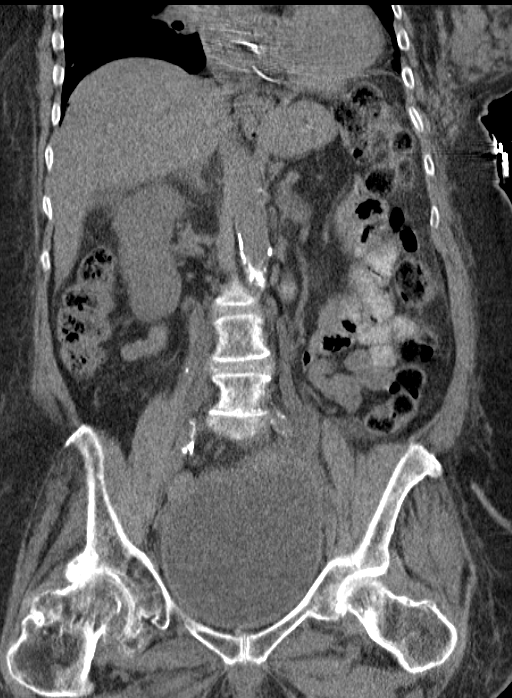

[15 of 46 positions shown; findings below may reference images not displayed]

FINDINGS: Lower chest: No significant pulmonary nodules or acute consolidative
airspace disease. There is a bulla/pneumatocele at the right lower
lobe base. Pacer leads are seen terminating in the right atrium and
right ventricular apex. Trace pericardial fluid/thickening. Coronary
atherosclerosis. Nonspecific calcifications are present throughout
both breasts.

Hepatobiliary: Normal liver with no liver mass. Normal gallbladder
with no radiopaque cholelithiasis. No biliary ductal dilatation.

Pancreas: There is complete fatty replacement of the pancreas, with
no pancreatic mass or appreciable pancreatic ductal dilatation.

Spleen: Normal size. No mass.

Adrenals/Urinary Tract: Mild diffuse thickening of both adrenal
glands, without discrete adrenal nodule, suggesting mild adrenal
hyperplasia. There is symmetric mild bilateral
hydroureteronephrosis. No renal or ureteral stones. No contour
deforming renal masses. A prominently distended urinary bladder with
diffuse bladder wall thickening and prominent fat stranding
surrounding the entire bladder wall.

Stomach/Bowel: Grossly normal stomach. Normal caliber small bowel
with no small bowel wall thickening. Appendix is not discretely
visualized. Normal large bowel with no diverticulosis, large bowel
wall thickening or pericolonic fat stranding.

Vascular/Lymphatic: Atherosclerotic nonaneurysmal abdominal aorta.
No pathologically enlarged lymph nodes in the abdomen or pelvis.

Reproductive: No adnexal mass. The region of the uterus is poorly
visualized on this noncontrast study. Uterus could be atrophic or
surgically absent.

Other: No pneumoperitoneum, ascites or focal fluid collection.

Musculoskeletal: No aggressive appearing focal osseous lesions.
Severe asymmetric osteoarthritis in the right hip joint. Moderate
degenerative changes in the visualized thoracolumbar spine.
IMPRESSION: 1. Prominently distended urinary bladder. Mild bilateral
hydroureteronephrosis. Diffuse bladder wall thickening and prominent
perivesical fat stranding. Findings suggest acute infectious or
inflammatory cystitis and bladder outlet obstruction. Urgent Foley
catheter placement advised.
2. Additional findings include coronary atherosclerosis, mild
adrenal hyperplasia and severe right hip osteoarthritis.

These results were called by telephone at the time of interpretation
on 04/15/2015 at [DATE] to Dr. ANWER, who verbally acknowledged
these results.

## 2017-03-27 IMAGING — US US RENAL
2 series · 13 of 25 positions shown · non-contrast
Comparison: 04/15/2015

CLINICAL DATA: Acute kidney injury.

EXAM:
RENAL / URINARY TRACT ULTRASOUND COMPLETE

[Series 1: us renal · 0.24mm/px · 12 of 79 slices shown (1 of 2)]
[im 1/79]
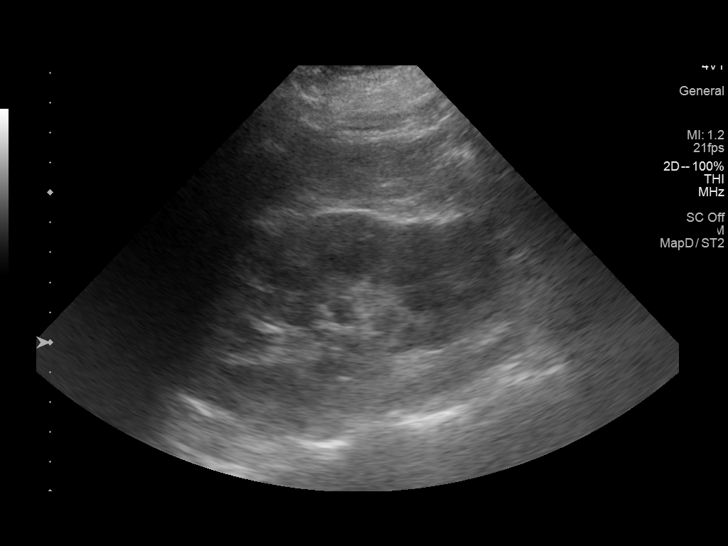
[im 7/79]
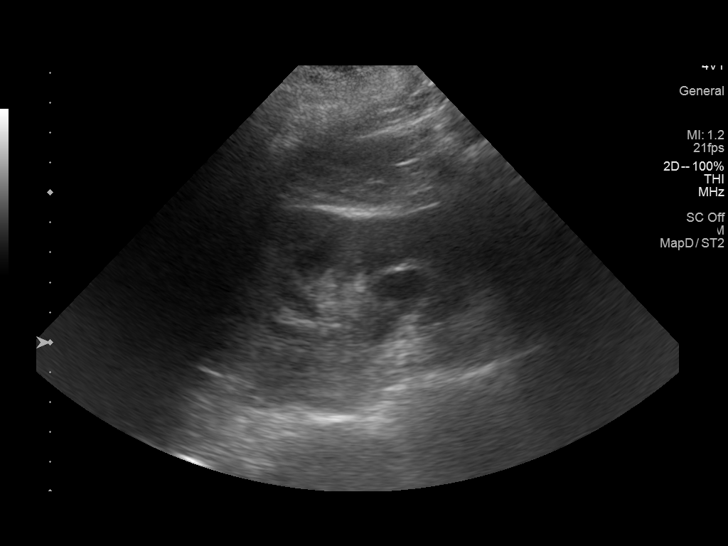
[im 14/79]
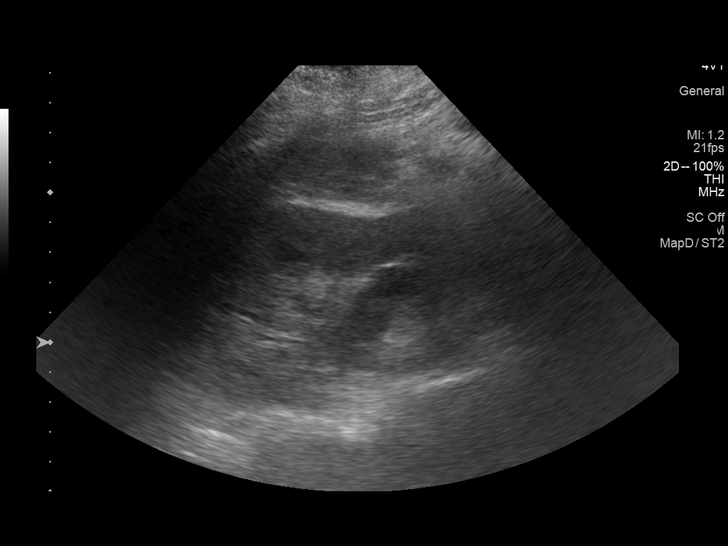
[im 21/79]
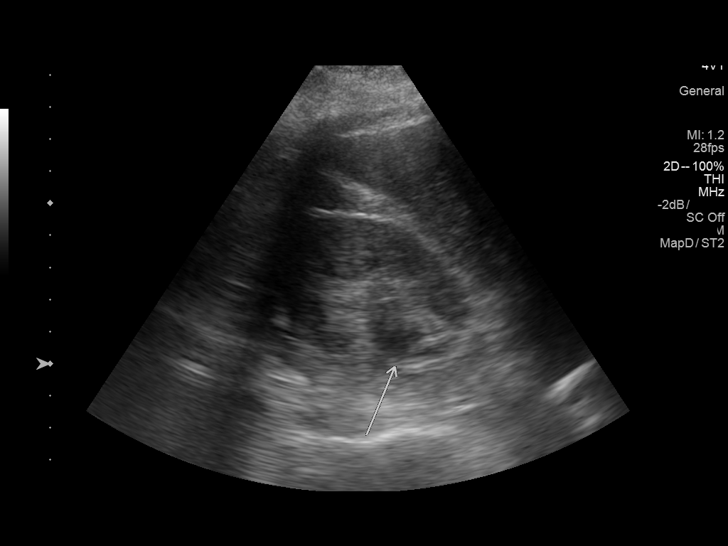
[im 28/79]
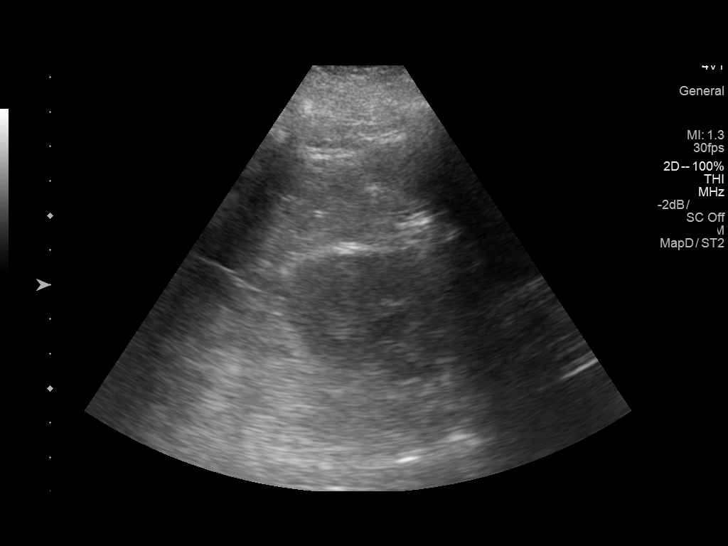
[im 34/79]
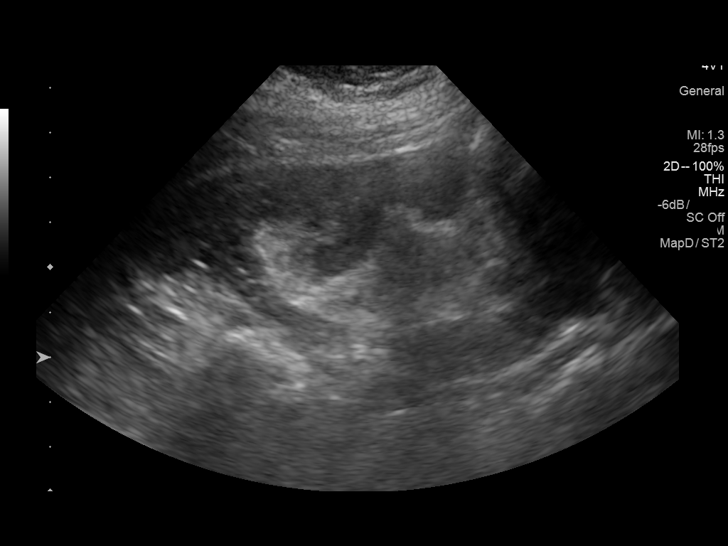
[im 41/79]
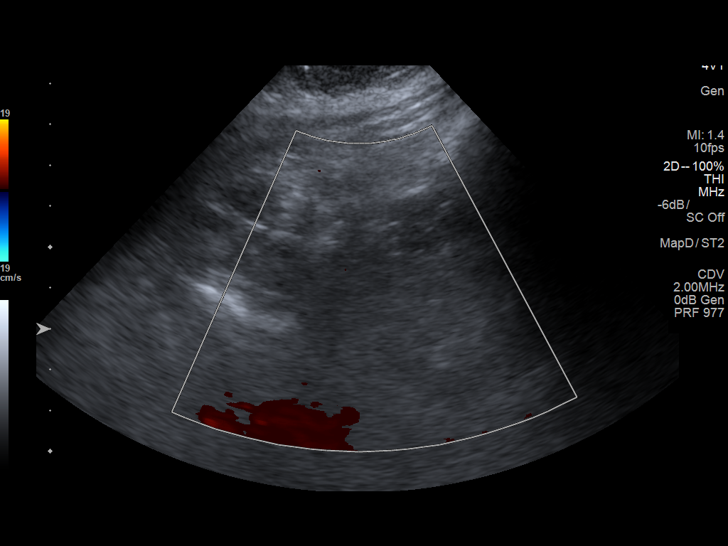
[im 48/79]
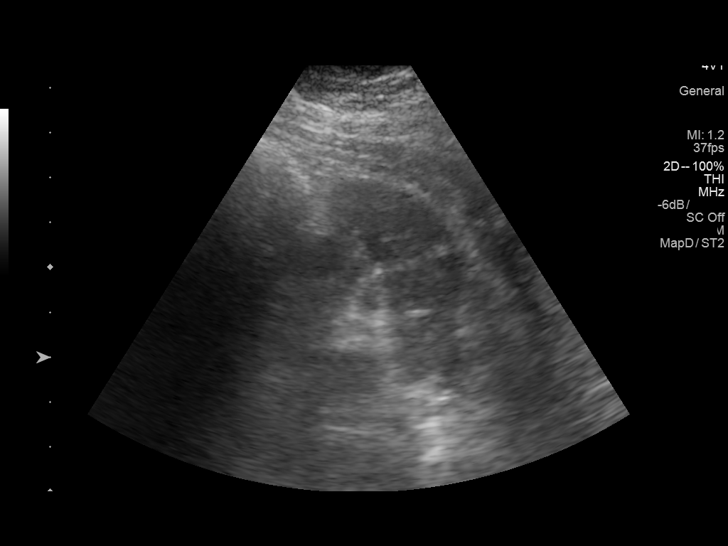
[im 55/79]
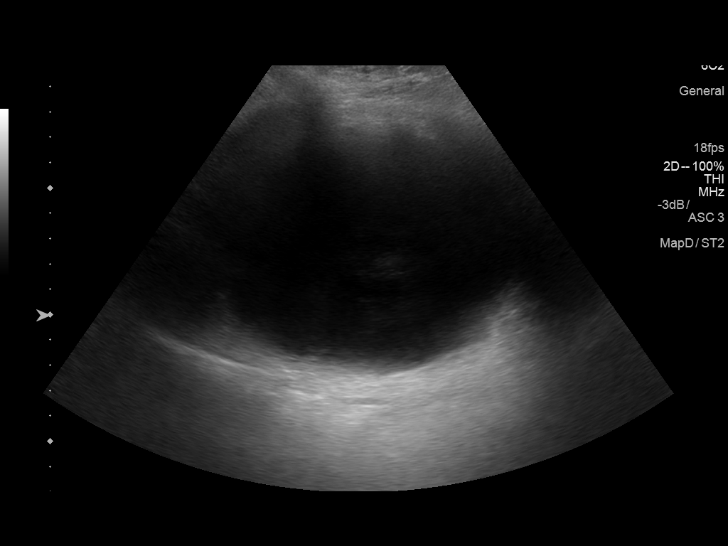
[im 62/79]
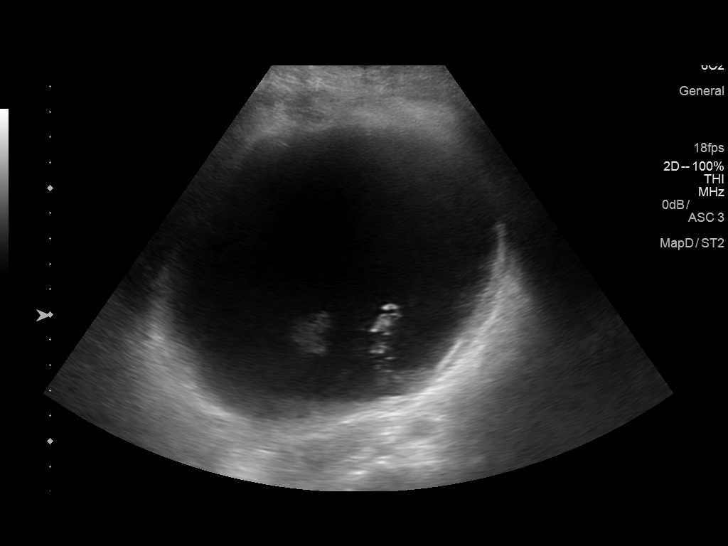
[im 68/79]
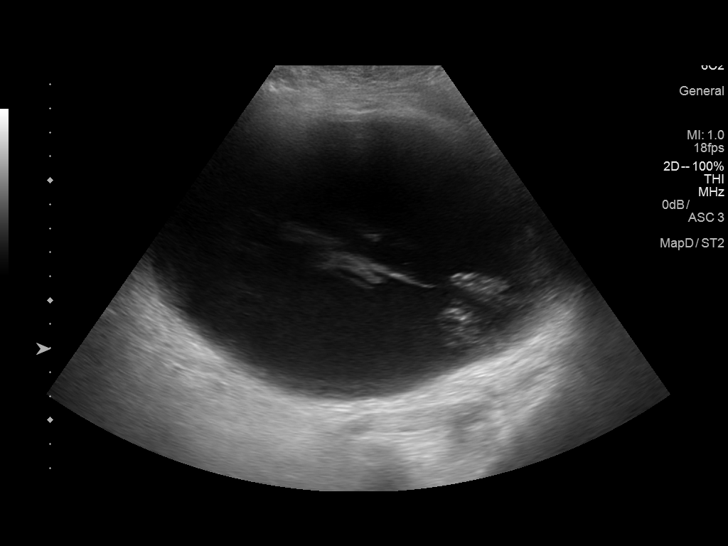
[im 75/79]
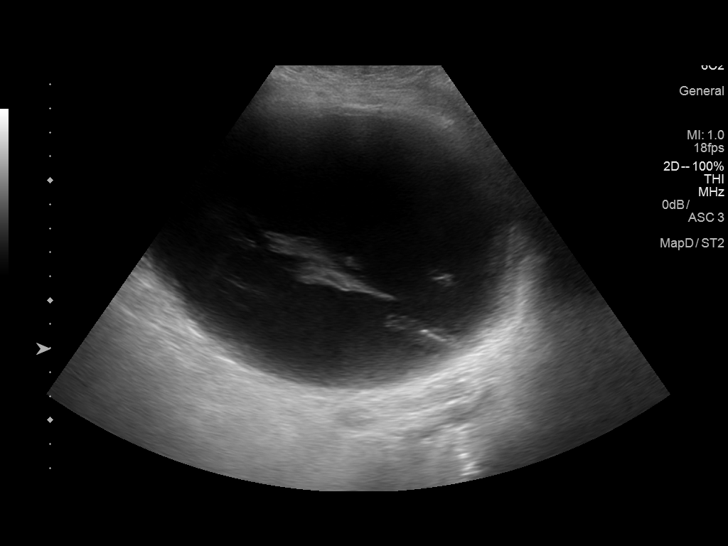

[Series 2001: us renal · 0.28mm/px · 1 of 2 slices shown (2 of 2)]
[im 1/2]
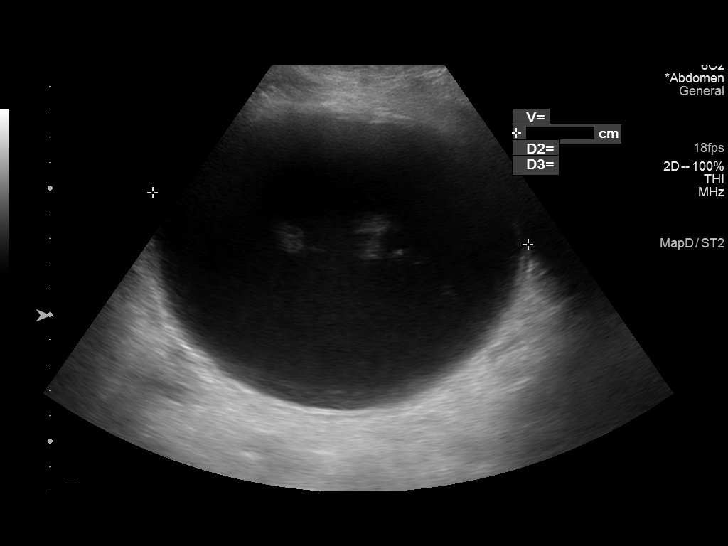

[13 of 25 positions shown; findings below may reference images not displayed]

FINDINGS: Right Kidney:

Length: 12.6 cm. Mild hydronephrosis. Normal echogenicity of the
renal parenchyma. No stones identified. No discrete renal mass seen.

Left Kidney:

Length: 12.1 cm. The kidney is very difficult to visualize due to
location of bowel and body habitus as well as difficulty positioning
the patient. Suspected mild left hydronephrosis.

Bladder:

There is abnormal echogenic filling defect in the urinary bladder.
We did not demonstrate any Doppler flow within this multilobular
filling defect which is irregular and accordingly difficult to
measure. We did not see ureteral jets.
IMPRESSION: 1. Abnormal filling defect in the urinary bladder without definite
internal flow. This is irregular and probably blood products.
2. We did not see ureteral jets of urine entering the urinary
bladder to reassure for complete patency of the ureters. Also, there
is bilateral hydronephrosis.
3. Very poor definition of the left kidney due to body habitus,
bowel positioning, and difficulty positioning the patient.
4. Renal echogenicity normal on the right and probably normal on the
left.
